# Patient Record
Sex: Female | Born: 1955 | Race: White | Hispanic: No | Marital: Married | Smoking: Never smoker
Health system: Southern US, Community
[De-identification: ages and names within clinical notes are randomized; demographics above are authoritative.]

## PROBLEM LIST (undated history)

## (undated) ENCOUNTER — Ambulatory Visit: Admission: EM | Payer: 59

## (undated) DIAGNOSIS — F411 Generalized anxiety disorder: Secondary | ICD-10-CM

## (undated) DIAGNOSIS — F329 Major depressive disorder, single episode, unspecified: Secondary | ICD-10-CM

## (undated) DIAGNOSIS — G4730 Sleep apnea, unspecified: Secondary | ICD-10-CM

## (undated) DIAGNOSIS — I10 Essential (primary) hypertension: Secondary | ICD-10-CM

## (undated) DIAGNOSIS — J189 Pneumonia, unspecified organism: Secondary | ICD-10-CM

## (undated) DIAGNOSIS — F32A Depression, unspecified: Secondary | ICD-10-CM

## (undated) DIAGNOSIS — G4700 Insomnia, unspecified: Secondary | ICD-10-CM

## (undated) DIAGNOSIS — K219 Gastro-esophageal reflux disease without esophagitis: Secondary | ICD-10-CM

## (undated) DIAGNOSIS — C44310 Basal cell carcinoma of skin of unspecified parts of face: Secondary | ICD-10-CM

## (undated) DIAGNOSIS — M1990 Unspecified osteoarthritis, unspecified site: Secondary | ICD-10-CM

## (undated) HISTORY — PX: DILATION AND CURETTAGE OF UTERUS: SHX78

## (undated) HISTORY — PX: WISDOM TOOTH EXTRACTION: SHX21

## (undated) HISTORY — DX: Basal cell carcinoma of skin of unspecified parts of face: C44.310

## (undated) HISTORY — DX: Generalized anxiety disorder: F41.1

## (undated) HISTORY — PX: COLONOSCOPY: SHX174

---

## 1998-10-15 ENCOUNTER — Other Ambulatory Visit: Admission: RE | Admit: 1998-10-15 | Discharge: 1998-10-15 | Payer: Self-pay | Admitting: Gynecology

## 1999-10-19 ENCOUNTER — Other Ambulatory Visit: Admission: RE | Admit: 1999-10-19 | Discharge: 1999-10-19 | Payer: Self-pay | Admitting: Gynecology

## 2000-10-25 ENCOUNTER — Other Ambulatory Visit: Admission: RE | Admit: 2000-10-25 | Discharge: 2000-10-25 | Payer: Self-pay | Admitting: Gynecology

## 2001-10-15 ENCOUNTER — Other Ambulatory Visit: Admission: RE | Admit: 2001-10-15 | Discharge: 2001-10-15 | Payer: Self-pay | Admitting: Gynecology

## 2002-10-28 ENCOUNTER — Other Ambulatory Visit: Admission: RE | Admit: 2002-10-28 | Discharge: 2002-10-28 | Payer: Self-pay | Admitting: Gynecology

## 2003-10-09 ENCOUNTER — Other Ambulatory Visit: Admission: RE | Admit: 2003-10-09 | Discharge: 2003-10-09 | Payer: Self-pay | Admitting: Gynecology

## 2004-10-19 ENCOUNTER — Other Ambulatory Visit: Admission: RE | Admit: 2004-10-19 | Discharge: 2004-10-19 | Payer: Self-pay | Admitting: Gynecology

## 2005-10-27 ENCOUNTER — Other Ambulatory Visit: Admission: RE | Admit: 2005-10-27 | Discharge: 2005-10-27 | Payer: Self-pay | Admitting: Gynecology

## 2006-11-02 ENCOUNTER — Other Ambulatory Visit: Admission: RE | Admit: 2006-11-02 | Discharge: 2006-11-02 | Payer: Self-pay | Admitting: Gynecology

## 2008-01-22 ENCOUNTER — Other Ambulatory Visit: Admission: RE | Admit: 2008-01-22 | Discharge: 2008-01-22 | Payer: Self-pay | Admitting: Gynecology

## 2008-02-13 ENCOUNTER — Ambulatory Visit (HOSPITAL_COMMUNITY): Admission: RE | Admit: 2008-02-13 | Discharge: 2008-02-13 | Payer: Self-pay | Admitting: Gynecology

## 2010-11-21 ENCOUNTER — Encounter: Payer: Self-pay | Admitting: Gynecology

## 2011-07-26 LAB — CREATININE, SERUM
Creatinine, Ser: 0.79
GFR calc Af Amer: >60
GFR calc non Af Amer: >60

## 2012-08-13 ENCOUNTER — Other Ambulatory Visit: Payer: Self-pay | Admitting: Orthopedic Surgery

## 2012-08-24 ENCOUNTER — Encounter (HOSPITAL_BASED_OUTPATIENT_CLINIC_OR_DEPARTMENT_OTHER): Payer: Self-pay | Admitting: *Deleted

## 2012-08-24 NOTE — Progress Notes (Signed)
[facility] [place]-to come in for [id number]

## 2012-08-28 ENCOUNTER — Encounter (HOSPITAL_BASED_OUTPATIENT_CLINIC_OR_DEPARTMENT_OTHER)
Admission: RE | Admit: 2012-08-28 | Discharge: 2012-08-28 | Disposition: A | Discharge status: Home / Self Care | Payer: 59 | Source: Ambulatory Visit | Attending: Orthopedic Surgery | Admitting: Orthopedic Surgery

## 2012-08-28 LAB — BASIC METABOLIC PANEL
BUN: 13 mg/dL (ref 6–23)
CO2: 26 mEq/L (ref 19–32)
Calcium: 9.1 mg/dL (ref 8.4–10.5)
Chloride: 100 mEq/L (ref 96–112)
Creatinine, Ser: 0.72 mg/dL (ref 0.50–1.10)
GFR calc Af Amer: >90 mL/min (ref >90)
GFR calc non Af Amer: >90 mL/min (ref >90)
Glucose, Bld: 82 mg/dL (ref 70–99)
Potassium: 3.9 mEq/L (ref 3.5–5.1)
Sodium: 136 mEq/L (ref 135–145)

## 2012-08-30 ENCOUNTER — Encounter (HOSPITAL_BASED_OUTPATIENT_CLINIC_OR_DEPARTMENT_OTHER): Payer: Self-pay | Admitting: Anesthesiology

## 2012-08-30 ENCOUNTER — Encounter (HOSPITAL_BASED_OUTPATIENT_CLINIC_OR_DEPARTMENT_OTHER): Payer: Self-pay

## 2012-08-30 ENCOUNTER — Encounter (HOSPITAL_BASED_OUTPATIENT_CLINIC_OR_DEPARTMENT_OTHER): Payer: Self-pay | Admitting: *Deleted

## 2012-08-30 ENCOUNTER — Encounter (HOSPITAL_BASED_OUTPATIENT_CLINIC_OR_DEPARTMENT_OTHER)
Admission: RE | Disposition: A | Discharge status: Home / Self Care | Payer: Self-pay | Source: Ambulatory Visit | Attending: Orthopedic Surgery

## 2012-08-30 ENCOUNTER — Ambulatory Visit (HOSPITAL_BASED_OUTPATIENT_CLINIC_OR_DEPARTMENT_OTHER)
Admission: RE | Admit: 2012-08-30 | Discharge: 2012-08-30 | Disposition: A | Discharge status: Home / Self Care | Payer: 59 | Source: Ambulatory Visit | Attending: Orthopedic Surgery | Admitting: Orthopedic Surgery

## 2012-08-30 ENCOUNTER — Ambulatory Visit (HOSPITAL_BASED_OUTPATIENT_CLINIC_OR_DEPARTMENT_OTHER): Payer: 59 | Admitting: *Deleted

## 2012-08-30 DIAGNOSIS — G4700 Insomnia, unspecified: Secondary | ICD-10-CM | POA: Insufficient documentation

## 2012-08-30 DIAGNOSIS — F329 Major depressive disorder, single episode, unspecified: Secondary | ICD-10-CM | POA: Insufficient documentation

## 2012-08-30 DIAGNOSIS — I10 Essential (primary) hypertension: Secondary | ICD-10-CM | POA: Insufficient documentation

## 2012-08-30 DIAGNOSIS — M129 Arthropathy, unspecified: Secondary | ICD-10-CM | POA: Insufficient documentation

## 2012-08-30 DIAGNOSIS — Z79899 Other long term (current) drug therapy: Secondary | ICD-10-CM | POA: Insufficient documentation

## 2012-08-30 DIAGNOSIS — M249 Joint derangement, unspecified: Secondary | ICD-10-CM | POA: Insufficient documentation

## 2012-08-30 DIAGNOSIS — M66239 Spontaneous rupture of extensor tendons, unspecified forearm: Secondary | ICD-10-CM

## 2012-08-30 DIAGNOSIS — Z01812 Encounter for preprocedural laboratory examination: Secondary | ICD-10-CM | POA: Insufficient documentation

## 2012-08-30 DIAGNOSIS — F3289 Other specified depressive episodes: Secondary | ICD-10-CM | POA: Insufficient documentation

## 2012-08-30 DIAGNOSIS — Z01810 Encounter for preprocedural cardiovascular examination: Secondary | ICD-10-CM | POA: Insufficient documentation

## 2012-08-30 HISTORY — PX: TENDON TRANSFER: SHX6109

## 2012-08-30 HISTORY — DX: Major depressive disorder, single episode, unspecified: F32.9

## 2012-08-30 HISTORY — DX: Depression, unspecified: F32.A

## 2012-08-30 HISTORY — DX: Essential (primary) hypertension: I10

## 2012-08-30 HISTORY — DX: Unspecified osteoarthritis, unspecified site: M19.90

## 2012-08-30 HISTORY — DX: Insomnia, unspecified: G47.00

## 2012-08-30 LAB — POCT HEMOGLOBIN-HEMACUE: Hemoglobin: 15.2 g/dL — ABNORMAL HIGH (ref 12.0–15.0)

## 2012-08-30 SURGERY — TRANSFER, TENDON
Anesthesia: General | Site: Thumb | Laterality: Left | Wound class: Clean

## 2012-08-30 MED ORDER — OXYCODONE HCL 5 MG PO TABS: 5.0000 mg | ORAL_TABLET | ORAL | Status: DC | PRN

## 2012-08-30 MED ORDER — LIDOCAINE HCL (CARDIAC) 20 MG/ML IV SOLN
INTRAVENOUS | Status: DC | PRN
  Administered 2012-08-30: 50 mg via INTRAVENOUS

## 2012-08-30 MED ORDER — HYDROMORPHONE HCL PF 1 MG/ML IJ SOLN: 0.2500 mg | INTRAMUSCULAR | Status: DC | PRN

## 2012-08-30 MED ORDER — MEPERIDINE HCL 25 MG/ML IJ SOLN: 6.2500 mg | INTRAMUSCULAR | Status: DC | PRN

## 2012-08-30 MED ORDER — OXYCODONE HCL 5 MG PO TABS: 5.0000 mg | ORAL_TABLET | Freq: Once | ORAL | Status: DC | PRN

## 2012-08-30 MED ORDER — FENTANYL CITRATE 0.05 MG/ML IJ SOLN
50.0000 ug | INTRAMUSCULAR | Status: DC | PRN
  Administered 2012-08-30: 100 ug via INTRAVENOUS

## 2012-08-30 MED ORDER — PROPOFOL 10 MG/ML IV BOLUS
INTRAVENOUS | Status: DC | PRN
  Administered 2012-08-30: 200 mg via INTRAVENOUS

## 2012-08-30 MED ORDER — EPHEDRINE SULFATE 50 MG/ML IJ SOLN
INTRAMUSCULAR | Status: DC | PRN
  Administered 2012-08-30 (×3): 5 mg via INTRAVENOUS

## 2012-08-30 MED ORDER — DEXAMETHASONE SODIUM PHOSPHATE 10 MG/ML IJ SOLN
INTRAMUSCULAR | Status: DC | PRN
  Administered 2012-08-30: 10 mg via INTRAVENOUS

## 2012-08-30 MED ORDER — LACTATED RINGERS IV SOLN
INTRAVENOUS | Status: DC
  Administered 2012-08-30 (×2): via INTRAVENOUS

## 2012-08-30 MED ORDER — SODIUM CHLORIDE 0.45 % IV SOLN: INTRAVENOUS | Status: DC

## 2012-08-30 MED ORDER — ROPIVACAINE HCL 5 MG/ML IJ SOLN
INTRAMUSCULAR | Status: DC | PRN
  Administered 2012-08-30: 30 mL via EPIDURAL

## 2012-08-30 MED ORDER — OXYCODONE HCL 5 MG/5ML PO SOLN: 5.0000 mg | Freq: Once | ORAL | Status: DC | PRN

## 2012-08-30 MED ORDER — CHLORHEXIDINE GLUCONATE 4 % EX LIQD: 60.0000 mL | Freq: Once | CUTANEOUS | Status: DC

## 2012-08-30 MED ORDER — MIDAZOLAM HCL 2 MG/2ML IJ SOLN
1.0000 mg | INTRAMUSCULAR | Status: DC | PRN
  Administered 2012-08-30: 2 mg via INTRAVENOUS

## 2012-08-30 MED ORDER — ONDANSETRON HCL 4 MG/2ML IJ SOLN: 4.0000 mg | Freq: Once | INTRAMUSCULAR | Status: DC | PRN

## 2012-08-30 MED ORDER — ONDANSETRON HCL 4 MG/2ML IJ SOLN
INTRAMUSCULAR | Status: DC | PRN
  Administered 2012-08-30: 4 mg via INTRAVENOUS

## 2012-08-30 MED ORDER — METHOCARBAMOL 500 MG PO TABS: 500.0000 mg | ORAL_TABLET | Freq: Four times a day (QID) | ORAL | Status: DC

## 2012-08-30 MED ORDER — CEFAZOLIN SODIUM-DEXTROSE 2-3 GM-% IV SOLR
2.0000 g | INTRAVENOUS | Status: AC
  Administered 2012-08-30: 2 g via INTRAVENOUS

## 2012-08-30 SURGICAL SUPPLY — 68 items
BANDAGE CONFORM 3  STR LF (GAUZE/BANDAGES/DRESSINGS) ×2 IMPLANT
BANDAGE ELASTIC 3 VELCRO ST LF (GAUZE/BANDAGES/DRESSINGS) ×2 IMPLANT
BANDAGE GAUZE ELAST BULKY 4 IN (GAUZE/BANDAGES/DRESSINGS) ×2 IMPLANT
BLADE OSC/SAG .038X5.5 CUT EDG (BLADE) IMPLANT
BLADE SURG 15 STRL LF DISP TIS (BLADE) ×3 IMPLANT
BLADE SURG 15 STRL SS (BLADE) ×6
BLADE SURG ROTATE 9660 (MISCELLANEOUS) IMPLANT
BRUSH SCRUB EZ PLAIN DRY (MISCELLANEOUS) ×2 IMPLANT
CANISTER SUCTION 1200CC (MISCELLANEOUS) IMPLANT
CLOTH BEACON ORANGE TIMEOUT ST (SAFETY) ×2 IMPLANT
CORDS BIPOLAR (ELECTRODE) ×2 IMPLANT
COVER MAYO STAND STRL (DRAPES) ×2 IMPLANT
COVER TABLE BACK 60X90 (DRAPES) ×2 IMPLANT
CUFF TOURNIQUET SINGLE 18IN (TOURNIQUET CUFF) ×2 IMPLANT
DECANTER SPIKE VIAL GLASS SM (MISCELLANEOUS) IMPLANT
DRAIN JACKSON RD 7FR 3/32 (WOUND CARE) IMPLANT
DRAPE EXTREMITY T 121X128X90 (DRAPE) ×2 IMPLANT
DRAPE SURG 17X23 STRL (DRAPES) ×2 IMPLANT
DRSG EMULSION OIL 3X3 NADH (GAUZE/BANDAGES/DRESSINGS) ×2 IMPLANT
GAUZE SPONGE 4X4 16PLY XRAY LF (GAUZE/BANDAGES/DRESSINGS) IMPLANT
GAUZE XEROFORM 1X8 LF (GAUZE/BANDAGES/DRESSINGS) ×2 IMPLANT
GLOVE BIO SURGEON STRL SZ8 (GLOVE) IMPLANT
GLOVE SKINSENSE NS SZ6.5 (GLOVE) ×2
GLOVE SKINSENSE NS SZ7.5 (GLOVE) ×1
GLOVE SKINSENSE NS SZ8.0 LF (GLOVE) ×1
GLOVE SKINSENSE STRL SZ6.5 (GLOVE) ×2 IMPLANT
GLOVE SKINSENSE STRL SZ7.5 (GLOVE) ×1 IMPLANT
GLOVE SKINSENSE STRL SZ8.0 LF (GLOVE) ×1 IMPLANT
GLOVE SS BIOGEL STRL SZ 8 (GLOVE) IMPLANT
GLOVE SUPERSENSE BIOGEL SZ 8 (GLOVE)
GOWN PREVENTION PLUS XLARGE (GOWN DISPOSABLE) ×4 IMPLANT
GOWN PREVENTION PLUS XXLARGE (GOWN DISPOSABLE) ×2 IMPLANT
NEEDLE HYPO 22GX1.5 SAFETY (NEEDLE) IMPLANT
NEEDLE HYPO 25X1 1.5 SAFETY (NEEDLE) IMPLANT
NS IRRIG 1000ML POUR BTL (IV SOLUTION) ×2 IMPLANT
PACK BASIN DAY SURGERY FS (CUSTOM PROCEDURE TRAY) ×2 IMPLANT
PAD CAST 3X4 CTTN HI CHSV (CAST SUPPLIES) ×1 IMPLANT
PADDING CAST ABS 3INX4YD NS (CAST SUPPLIES) ×1
PADDING CAST ABS 4INX4YD NS (CAST SUPPLIES) ×1
PADDING CAST ABS COTTON 3X4 (CAST SUPPLIES) ×1 IMPLANT
PADDING CAST ABS COTTON 4X4 ST (CAST SUPPLIES) ×1 IMPLANT
PADDING CAST COTTON 3X4 STRL (CAST SUPPLIES) ×1
PASSER SUT SWANSON 36MM LOOP (INSTRUMENTS) IMPLANT
SCOTCHCAST PLUS 3X4 WHITE (CAST SUPPLIES) ×4 IMPLANT
SHEET MEDIUM DRAPE 40X70 STRL (DRAPES) IMPLANT
SPLINT PLASTER CAST XFAST 3X15 (CAST SUPPLIES) IMPLANT
SPLINT PLASTER XTRA FASTSET 3X (CAST SUPPLIES)
SPONGE GAUZE 4X4 12PLY (GAUZE/BANDAGES/DRESSINGS) ×2 IMPLANT
STOCKINETTE 4X48 STRL (DRAPES) ×2 IMPLANT
STOCKINETTE SYNTHETIC 3 UNSTER (CAST SUPPLIES) ×2 IMPLANT
SUCTION FRAZIER TIP 10 FR DISP (SUCTIONS) ×2 IMPLANT
SUT BONE WAX W31G (SUTURE) IMPLANT
SUT FIBERWIRE 3-0 18 TAPR NDL (SUTURE) ×2
SUT FIBERWIRE 4-0 18 TAPR NDL (SUTURE) ×4
SUT PROLENE 4 0 PS 2 18 (SUTURE) ×4 IMPLANT
SUT VIC AB 4-0 P-3 18XBRD (SUTURE) IMPLANT
SUT VIC AB 4-0 P3 18 (SUTURE)
SUTURE FIBERWR 3-0 18 TAPR NDL (SUTURE) ×1 IMPLANT
SUTURE FIBERWR 4-0 18 TAPR NDL (SUTURE) ×2 IMPLANT
SYR BULB 3OZ (MISCELLANEOUS) ×2 IMPLANT
SYR CONTROL 10ML LL (SYRINGE) IMPLANT
TAPE SURG TRANSPORE 1 IN (GAUZE/BANDAGES/DRESSINGS) ×1 IMPLANT
TAPE SURGICAL TRANSPORE 1 IN (GAUZE/BANDAGES/DRESSINGS) ×1
TOWEL OR 17X24 6PK STRL BLUE (TOWEL DISPOSABLE) ×2 IMPLANT
TOWEL OR NON WOVEN STRL DISP B (DISPOSABLE) ×2 IMPLANT
TUBE CONNECTING 20X1/4 (TUBING) ×2 IMPLANT
UNDERPAD 30X30 INCONTINENT (UNDERPADS AND DIAPERS) ×2 IMPLANT
WATER STERILE IRR 1000ML POUR (IV SOLUTION) IMPLANT

## 2012-08-30 NOTE — Anesthesia Procedure Notes (Addendum)
Anesthesia Regional Block:  Supraclavicular block  Pre-Anesthetic Checklist: ,, timeout performed, Correct Patient, Correct Site, Correct Laterality, Correct Procedure, Correct Position, site marked, Risks and benefits discussed,  Surgical consent,  Pre-op evaluation,  At surgeon's request and post-op pain management  Laterality: Left  Prep: chloraprep       Needles:  Injection technique: Single-shot  Needle Type: Echogenic Stimulator Needle     Needle Length: 5cm 5 cm     Additional Needles:  Procedures: ultrasound guided (picture in chart) and nerve stimulator Supraclavicular block  Nerve Stimulator or Paresthesia:  Response: 0.4 mA,   Additional Responses:   Narrative:  Start time: 08/30/2012 12:40 PM End time: 08/30/2012 12:50 PM Injection made incrementally with aspirations every 5 mL.  Performed by: Personally  Anesthesiologist: Arta Bruce MD  Additional Notes: Monitors applied. Patient sedated. Sterile prep and drape,hand hygiene and sterile gloves were used. Relevant anatomy identified.Needle position confirmed.Local anesthetic injected incrementally after negative aspiration. Local anesthetic spread visualized around nerve(s). Vascular puncture avoided. No complications. Image printed for medical record.The patient tolerated the procedure well.       Supraclavicular block Procedure Name: LMA Insertion Date/Time: 08/30/2012 1:51 PM Performed by: Suann Larry WOLFE Pre-anesthesia Checklist: Patient identified, Emergency Drugs available, Suction available and Patient being monitored Patient Re-evaluated:Patient Re-evaluated prior to inductionOxygen Delivery Method: Circle System Utilized Preoxygenation: Pre-oxygenation with 100% oxygen Intubation Type: IV induction Ventilation: Mask ventilation without difficulty LMA: LMA inserted LMA Size: 4.0 Number of attempts: 1 Airway Equipment and Method: bite block Placement Confirmation: positive ETCO2 and breath  sounds checked- equal and bilateral Tube secured with: Tape Dental Injury: Teeth and Oropharynx as per pre-operative assessment

## 2012-08-30 NOTE — H&P (Signed)
Whitney [name] is an 56 y.o. female.   Chief Complaint: Left EPL Rupture HPI: Whitney KitchenMarland KitchenPatient presents for evaluation and treatment of the of their upper extremity predicament. The patient denies neck back chest or of abdominal pain. The patient notes that they have no lower extremity problems. The patient from primarily complains of the upper extremity pain noted.  Past Medical History  Diagnosis Date  . Hypertension   . Depression   . Insomnia   . Arthritis     Past Surgical History  Procedure Date  . Cesarean section     x2  . Dilation and curettage of uterus   . Wisdom tooth extraction     History reviewed. No pertinent family history. Social History:  reports that she quit smoking about 10 years ago. She does not have any smokeless tobacco history on file. She reports that she drinks alcohol. She reports that she does not use illicit drugs.  Allergies:  Allergies  Allergen Reactions  . Latex Rash    Medications Prior to Admission  Medication Sig Dispense Refill  . citalopram (CELEXA) 20 MG tablet Take 20 mg by mouth at bedtime. Takes 1 1/2      . losartan (COZAAR) 50 MG tablet Take 50 mg by mouth at bedtime.      . metoprolol succinate (TOPROL-XL) 100 MG 24 hr tablet Take 100 mg by mouth at bedtime. Take with or immediately following a meal.      . traMADol (ULTRAM) 50 MG tablet Take 50 mg by mouth every 6 (six) hours as needed.      . traZODone (DESYREL) 50 MG tablet Take 50 mg by mouth at bedtime.        Results for orders placed during the hospital encounter of 08/30/12 (from the past 48 hour(s))  POCT HEMOGLOBIN-HEMACUE     Status: Abnormal   Collection Time   08/30/12 12:16 PM      Component Value Range Comment   Hemoglobin 15.2 (*) 12.0 - 15.0 g/dL    No results found.  Review of Systems  Constitutional: Negative.   HENT: Negative.   Eyes: Negative.   Respiratory: Negative.   Cardiovascular: Negative.   Gastrointestinal: Negative.   Genitourinary:  Negative.   Skin: Negative.   Endo/Heme/Allergies: Negative.     Blood pressure 96/67, pulse 75, temperature 98.3 F (36.8 C), temperature source Oral, resp. rate 14, height 5\' 7"  (1.702 m), weight 73.936 kg (163 lb), SpO2 93.00%. Physical Exam ..The patient is alert and oriented in no acute distress the patient complains of pain in the affected upper extremity. The patient is noted to have a normal HEENT exam. Lung fields show equal chest expansion and no shortness of breath abdomen exam is nontender without distention. Lower extremity examination does not show any fracture dislocation or blood clot symptoms. Pelvis is stable neck and back are stable and nontender  Assessment/Plan .Whitney KitchenWe are planning surgery for your upper extremity. The risk and benefits of surgery include risk of bleeding infection anesthesia damage to normal structures and failure of the surgery to accomplish its intended goals of relieving symptoms and restoring function with this in mind we'll going to proceed. I have specifically discussed with the patient the pre-and postoperative regime and the does and don'ts and risk and benefits in great detail. Risk and benefits of surgery also include risk of dystrophy chronic nerve pain failure of the healing process to go onto completion and other inherent risks of surgery The relavent the pathophysiology  of the disease/injury process, as well as the alternatives for treatment and postoperative course of action has been discussed in great detail with the patient who desires to proceed.  We will do everything in our power to help you (the patient) restore function to the upper extremity. Is a pleasure to see this patient today.   Karen Chafe 08/30/2012, 1:37 PM

## 2012-08-30 NOTE — Anesthesia Postprocedure Evaluation (Signed)
  Anesthesia Post-op Note  Patient: Whitney [name]  Procedure(s) Performed: Procedure(s) (LRB) with comments: TENDON TRANSFER (Left) - LEFT THUMB EXTENSOR INDICUS POLLICUS TO EXTENSOR POLLICUS LONGUS TENDON TRANSFER GENERAL WITH BLOCK  Patient Location: PACU  Anesthesia Type:GA combined with regional for post-op pain  Level of Consciousness: awake and alert   Airway and Oxygen Therapy: Patient Spontanous Breathing  Post-op Pain: none  Post-op Assessment: Post-op Vital signs reviewed, Patient's Cardiovascular Status Stable, Respiratory Function Stable, Patent Airway and No signs of Nausea or vomiting  Post-op Vital Signs: Reviewed and stable  Complications: No apparent anesthesia complications

## 2012-08-30 NOTE — Transfer of Care (Signed)
Immediate Anesthesia Transfer of Care Note  Patient: Whitney [name]  Procedure(s) Performed: Procedure(s) (LRB) with comments: TENDON TRANSFER (Left) - LEFT THUMB EXTENSOR INDICUS POLLICUS TO EXTENSOR POLLICUS LONGUS TENDON TRANSFER GENERAL WITH BLOCK  Patient Location: PACU  Anesthesia Type:General and Regional  Level of Consciousness: awake, alert  and oriented  Airway & Oxygen Therapy: Patient Spontanous Breathing and Patient connected to face mask oxygen  Post-op Assessment: Report given to PACU RN and Post -op Vital signs reviewed and stable  Post vital signs: Reviewed and stable  Complications: No apparent anesthesia complications

## 2012-08-30 NOTE — Progress Notes (Signed)
Assisted Dr. Ossey with left, ultrasound guided, supraclavicular block. Side rails up, monitors on throughout procedure. See vital signs in flow sheet. Tolerated Procedure well. 

## 2012-08-30 NOTE — Anesthesia Preprocedure Evaluation (Addendum)
Anesthesia Evaluation  Patient identified by MRN, date of birth, ID band Patient awake    Reviewed: Allergy & Precautions, H&P , NPO status , Patient's Chart, lab work & pertinent test results  Airway Mallampati: I TM Distance: >3 FB Neck ROM: Full    Dental   Pulmonary          Cardiovascular hypertension, Pt. on medications     Neuro/Psych    GI/Hepatic   Endo/Other    Renal/GU      Musculoskeletal   Abdominal   Peds  Hematology   Anesthesia Other Findings   Reproductive/Obstetrics                           Anesthesia Physical Anesthesia Plan  ASA: II  Anesthesia Plan: General   Post-op Pain Management:    Induction: Intravenous  Airway Management Planned: LMA  Additional Equipment:   Intra-op Plan:   Post-operative Plan: Extubation in OR  Informed Consent: I have reviewed the patients History and Physical, chart, labs and discussed the procedure including the risks, benefits and alternatives for the proposed anesthesia with the patient or authorized representative who has indicated his/her understanding and acceptance.     Plan Discussed with: CRNA and Surgeon  Anesthesia Plan Comments:         Anesthesia Quick Evaluation  

## 2012-08-30 NOTE — Op Note (Signed)
See Dictation # [id number] Dominica Severin MD

## 2012-08-31 ENCOUNTER — Encounter (HOSPITAL_BASED_OUTPATIENT_CLINIC_OR_DEPARTMENT_OTHER): Payer: Self-pay | Admitting: Orthopedic Surgery

## 2012-09-03 NOTE — Op Note (Signed)
[name], [name]             ACCOUNT NO.:  [id number]  MEDICAL RECORD NO.:  [id number]  LOCATION:                                 FACILITY:  PHYSICIAN:  Dionne Ano. Adreena Willits, M.D.DATE OF BIRTH:  1955/11/23  DATE OF PROCEDURE:  08/30/2012 DATE OF DISCHARGE:                              OPERATIVE REPORT   PREOPERATIVE DIAGNOSIS:  Extensor pollicis longus tendon rupture, left wrist.  POSTOPERATIVE DIAGNOSIS:  Extensor pollicis longus tendon rupture, left wrist.  PROCEDURE: 1. Extensor indicis proprius tendon transfer to the extensor pollicis     longus secondary to chronic EPL tendon rupture. 2. EPL tenolysis, tenosynovectomy, extensive in nature.  SURGEON:  Dionne Ano. Amanda Pea, M.D.  ASSISTANT:  Karie Chimera, PA-C.  COMPLICATIONS:  None.  ANESTHESIA:  General.  INDICATIONS FOR THE PROCEDURE:  This patient is a 56 year old female who presents with the above-mentioned diagnosis.  I have counseled her in regards to risks and benefits of surgery including risk of infection, bleeding, anesthesia, damage to normal structures, and failure of surgery to accomplish its intended goals of relieving symptoms and restoring function.  With this in mind, she desires to proceed.  All questions have been encouraged and answered preoperatively.  OPERATIVE PROCEDURE:  The patient was seen by myself, anesthesia, taken to operative suite, time-out called.  Pre and postop check list was complete.  General anesthesia induced.  Prepped and draped in usual sterile fashion about the left upper extremity.  Once this done, time- out was called once again.  Tourniquet insufflated and an incision was made.  The patient had an incision made about the midportion of her 1st metacarpal dorsal sensory neurolysis of the radial branch to the superficial radial nerve and ulnar branch of the superficial radial nerve was accomplished, identified the ruptured stump in the EPL, performed tenolysis,  tenosynovectomy.  Following this, I then performed an incision about the area proximal to the second MCP joint.  Dissection was carried down.  The EIP and the [facility] tendons were evaluated and looked at.  The EIP tendon was severed, stumped distally, was sutured to the [facility].  Following this, I then made a counter incision at Lister's tubercle, dissected slightly in an ulnar direction, identified the EIP and retracted it.  Once this was done, the EIP was then subcutaneously tunneled to the EPL tendon in the main incision (the 1st incision created).  Superficial radial nerve was kept dorsal and out of harm's way.  I then performed a Pulvertaft weave technique and the tendon was repaired.  The patient tolerated this well.  There were no complicating features.  Following tendon repair with a Pulvertaft weave, I checked the tension multiple times.  I was able to make 4-5 passes with Pulvertaft weave and secured excellent fixation.  I was pleased with the tenodesis effect.  The patient had full extension with wrist flexion and a good gentle passive flexion with wrist extension.  I was pleased with the findings.  Following this, I irrigated with the tourniquet deflated, closed the wound with interrupted Prolene.  Tenolysis and tenosynovectomy was accomplished without difficulty.  There were no complicating features.  All sponge, needle, and instrument counts were reported as correct.  The patient will be monitored in the recovery room.  Discharged to home.  Elevation, range of motion, other measures will be adhered to.  Should any problems arise, the patient will notify me.  I would like to look forward to seeing the patient back in 12-14 days.  Sutures to be removed and a new cast applied at 4 weeks postop, will begin therapy and very gentle motion at 6 weeks postop, aggressive motion at 8 weeks postop.  If that all looks well, some mild gentle interval strengthening.     Dionne Ano. Amanda Pea,  M.D.     [facility]  D:  08/30/2012  T:  08/31/2012  Job:  [id number]

## 2013-04-11 ENCOUNTER — Other Ambulatory Visit (HOSPITAL_COMMUNITY): Payer: Self-pay | Admitting: Family Medicine

## 2013-04-11 DIAGNOSIS — R0602 Shortness of breath: Secondary | ICD-10-CM

## 2013-04-12 ENCOUNTER — Ambulatory Visit (HOSPITAL_COMMUNITY)
Admission: RE | Admit: 2013-04-12 | Discharge: 2013-04-12 | Disposition: A | Discharge status: Home / Self Care | Payer: 59 | Source: Ambulatory Visit | Attending: Family Medicine | Admitting: Family Medicine

## 2013-04-12 DIAGNOSIS — IMO0002 Reserved for concepts with insufficient information to code with codable children: Secondary | ICD-10-CM | POA: Insufficient documentation

## 2013-04-12 DIAGNOSIS — R918 Other nonspecific abnormal finding of lung field: Secondary | ICD-10-CM | POA: Insufficient documentation

## 2013-04-12 DIAGNOSIS — R0602 Shortness of breath: Secondary | ICD-10-CM | POA: Insufficient documentation

## 2013-04-12 DIAGNOSIS — K7689 Other specified diseases of liver: Secondary | ICD-10-CM | POA: Insufficient documentation

## 2013-04-12 MED ORDER — IOHEXOL 350 MG/ML SOLN
100.0000 mL | Freq: Once | INTRAVENOUS | Status: AC | PRN
  Administered 2013-04-12: 70 mL via INTRAVENOUS

## 2013-05-21 ENCOUNTER — Ambulatory Visit (INDEPENDENT_AMBULATORY_CARE_PROVIDER_SITE_OTHER): Payer: 59 | Admitting: Internal Medicine

## 2013-05-21 ENCOUNTER — Encounter: Payer: Self-pay | Admitting: Internal Medicine

## 2013-05-21 VITALS — BP 130/90 | HR 68 | Temp 98.2°F | Ht 65.0 in | Wt 176.0 lb

## 2013-05-21 DIAGNOSIS — R0989 Other specified symptoms and signs involving the circulatory and respiratory systems: Secondary | ICD-10-CM

## 2013-05-21 DIAGNOSIS — R0609 Other forms of dyspnea: Secondary | ICD-10-CM

## 2013-05-21 DIAGNOSIS — R918 Other nonspecific abnormal finding of lung field: Secondary | ICD-10-CM

## 2013-05-21 DIAGNOSIS — R0600 Dyspnea, unspecified: Secondary | ICD-10-CM

## 2013-05-21 MED ORDER — FAMOTIDINE 20 MG PO TABS: ORAL_TABLET | ORAL | Status: DC

## 2013-05-21 MED ORDER — METOPROLOL SUCCINATE ER 100 MG PO TB24: ORAL_TABLET | ORAL | Status: AC

## 2013-05-21 MED ORDER — PANTOPRAZOLE SODIUM 40 MG PO TBEC: 40.0000 mg | DELAYED_RELEASE_TABLET | Freq: Every day | ORAL | Status: DC

## 2013-05-21 NOTE — Progress Notes (Signed)
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    05/21/2013 1st pulmonary eval  Cc progressively worse doe x across the parking lot indolent onset starting about  6 months prior to OV  , and 3 months not as easy on steps, assoc with irritating dry cough, assoc with globus and sense of too much throat mucus. Was on acei d/c ? When,  Assoc nasal congestion, clariton didn't help.  Assoc also with intermittent hb but not consistently on rx.  No obvious daytime variabilty or assoc chronic cough or cp or chest tightness, subjective wheeze overt sinus or hb symptoms. No unusual exp hx or h/o childhood pna/ asthma or knowledge of premature birth.   Sleeping ok without nocturnal  or early am exacerbation  of respiratory  c/o's or need for noct saba. Also denies any obvious fluctuation of symptoms with weather or environmental changes or other aggravating or alleviating factors except as outlined above    Review of Systems  Constitutional: Negative for fever, chills and unexpected weight change.  HENT: Positive for congestion and trouble swallowing. Negative for ear pain, nosebleeds, sore throat, rhinorrhea, sneezing, dental problem, voice change, postnasal drip and sinus pressure.   Eyes: Negative for visual disturbance.  Respiratory: Positive for cough and shortness of breath. Negative for choking.   Cardiovascular: Negative for chest pain and leg swelling.  Gastrointestinal: Negative for vomiting, abdominal pain and diarrhea.  Genitourinary: Negative for difficulty urinating.       Acid Heartburn  Musculoskeletal: Negative for arthralgias.  Skin: Negative for rash.  Neurological: Negative for tremors,  syncope and headaches.  Hematological: Does not bruise/bleed easily.       Objective:   Physical Exam   amb wf with voice fatigue and classic pseudowheeze  Wt Readings from Last 3 Encounters:  05/21/13 176 lb (79.833 kg)  08/30/12 163 lb (73.936 kg)  08/30/12 163 lb (73.936 kg)    HEENT: nl dentition, turbinates, and orophanx. Nl external ear canals without cough reflex   NECK :  without JVD/Nodes/TM/ nl carotid upstrokes bilaterally   LUNGS: no acc muscle use, clear to A and P bilaterally without cough on insp or exp maneuvers   CV:  RRR  no s3 or murmur or increase in P2, no edema   ABD:  soft and nontender with nl excursion in the supine position. No bruits or organomegaly, bowel sounds nl  MS:  warm without deformities, calf tenderness, cyanosis or clubbing  SKIN: warm and dry without lesions    NEURO:  alert, approp, no deficits      04/11/13 CTa chest 1. No acute cardiopulmonary disease. Specifically, no evidence of  pulmonary embolism.  2. Indeterminate bilateral pulmonary nodules, the largest of which  measure 4 mm in diameter. If the patient is at high risk for  bronchogenic carcinoma, follow-up chest CT at 1 year is  recommended. If the patient is at low risk, no follow-up is  needed. This recommendation follows the consensus statement:  Guidelines for Management of Small Pulmonary Nodules Detected on CT  Scans: A Statement from the Fleischner Society as published in  Radiology  2005; 161:096-045.  3. Moderate multilevel thoracic spine DDD.  4. Hepatic steatosis.          Assessment & Plan:

## 2013-05-21 NOTE — Patient Instructions (Addendum)
Pantoprazole (protonix) 40 mg   Take 30-60 min before first meal of the day and Pepcid(famotidine) 20 mg one bedtime until return to office - this is the best way to tell whether stomach acid is contributing to your problem.    Stop prilosec   GERD (REFLUX)  is an extremely common cause of respiratory symptoms, many times with no significant heartburn at all.    It can be treated with medication, but also with lifestyle changes including avoidance of late meals, excessive alcohol, smoking cessation, and avoid fatty foods, chocolate, peppermint, colas, red wine, and acidic juices such as orange juice.  NO MINT OR MENTHOL PRODUCTS SO NO COUGH DROPS  USE SUGARLESS CANDY INSTEAD (jolley ranchers or Stover's)  NO OIL BASED VITAMINS - use powdered substitutes.    Please schedule a follow up office visit in 4 weeks, sooner if needed with pft's on return.

## 2013-05-22 DIAGNOSIS — R918 Other nonspecific abnormal finding of lung field: Secondary | ICD-10-CM | POA: Insufficient documentation

## 2013-05-22 NOTE — Assessment & Plan Note (Signed)
-   05/21/2013  Walked RA x 3 laps @ 185 ft each stopped due to  End of study, no desat  Not able to reproduce sob across the parking lot so Symptoms are   disproportionate to objective findings and not clear this is a lung problem but pt does appear to have difficult airway management issues.  DDX of  difficult airways managment all start with A and  include Adherence, Ace Inhibitors, Acid Reflux, Active Sinus Disease, Alpha 1 Antitripsin deficiency, Anxiety masquerading as Airways dz,  ABPA,  allergy(esp in young), Aspiration (esp in elderly), Adverse effects of DPI,  Active smokers, plus two Bs  = Bronchiectasis and Beta blocker use..and one C= CHF  ACEi already excluded but may have contributed to the problem and should be avoided in this setting   ? Acid reflux > freq the "cause" of acei intolerance and both serve to destabilize the upper airway leading to  Classic Upper airway cough syndrome, so named because it's frequently impossible to sort out how much is  CR/sinusitis with freq throat clearing (which can be related to primary GERD)   vs  causing  secondary (" extra esophageal")  GERD from wide swings in gastric pressure that occur with throat clearing, often  promoting self use of mint and menthol lozenges that reduce the lower esophageal sphincter tone and exacerbate the problem further in a cyclical fashion.   These are the same pts (now being labeled as having "irritable larynx syndrome" by some cough centers) who not infrequently have a history of having failed to tolerate ace inhibitors,  dry powder inhalers or biphosphonates or report having atypical reflux symptoms that don't respond to standard doses of PPI , and are easily confused as having aecopd or asthma flares by even experienced allergists/ pulmonologists.   rec max gerd rx then regroup with pft's in 4 weeks

## 2013-05-22 NOTE — Assessment & Plan Note (Signed)
She is low but not zero risk because of fm hx and passive smoke exp so Discussed in detail all the  indications, usual  risks and alternatives  relative to the benefits with patient who agrees to proceed with bronchoscopy with repeat study in 04/11/14

## 2013-06-27 ENCOUNTER — Ambulatory Visit: Payer: 59 | Admitting: Internal Medicine

## 2013-07-25 ENCOUNTER — Encounter: Payer: Self-pay | Admitting: Internal Medicine

## 2013-07-25 ENCOUNTER — Ambulatory Visit (INDEPENDENT_AMBULATORY_CARE_PROVIDER_SITE_OTHER): Payer: 59 | Admitting: Internal Medicine

## 2013-07-25 VITALS — BP 140/80 | HR 73 | Temp 98.7°F | Ht 66.0 in | Wt 176.0 lb

## 2013-07-25 DIAGNOSIS — R918 Other nonspecific abnormal finding of lung field: Secondary | ICD-10-CM

## 2013-07-25 DIAGNOSIS — R0609 Other forms of dyspnea: Secondary | ICD-10-CM

## 2013-07-25 DIAGNOSIS — R0600 Dyspnea, unspecified: Secondary | ICD-10-CM

## 2013-07-25 DIAGNOSIS — R0989 Other specified symptoms and signs involving the circulatory and respiratory systems: Secondary | ICD-10-CM

## 2013-07-25 LAB — PULMONARY FUNCTION TEST

## 2013-07-25 NOTE — Progress Notes (Signed)
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    05/21/2013 1st pulmonary eval  Cc progressively worse doe x across the parking lot indolent onset starting about  6 months prior to OV  , and 3 months not as easy on steps, assoc with irritating dry cough, assoc with globus and sense of too much throat mucus. Was on acei d/c ? When,  Assoc nasal congestion, clariton didn't help.  Assoc also with intermittent hb but not consistently on rx. rec Pantoprazole (protonix) 40 mg   Take 30-60 min before first meal of the day and Pepcid(famotidine) 20 mg one bedtime until return to office - this is the best way to tell whether stomach acid is contributing to your problem.   Stop prilosec  GERD diet   07/25/2013 f/u ov/Wert re: sob/ cough better p rx as above Chief Complaint  Patient presents with  . Followup with PFT    Cough has resolved and her breathing is much improved. No new co's today.  not limited from desired activities as long as pases herself, no need for saba or cough meds  No obvious daytime variabilty or assoc chronic cough or cp or chest tightness, subjective wheeze overt sinus or hb symptoms. No unusual exp hx or h/o childhood pna/ asthma or knowledge of premature birth.   Sleeping ok without nocturnal  or early am exacerbation  of respiratory  c/o's or need for noct saba. Also denies any obvious fluctuation of symptoms with weather or environmental changes or other aggravating or alleviating factors except as outlined above         Objective:   Physical Exam   amb wf with slt voice fatigue - much better   Wt Readings from Last 3 Encounters:  07/25/13 176 lb (79.833  kg)  05/21/13 176 lb (79.833 kg)  08/30/12 163 lb (73.936 kg)       HEENT: nl dentition, turbinates, and orophanx. Nl external ear canals without cough reflex   NECK :  without JVD/Nodes/TM/ nl carotid upstrokes bilaterally   LUNGS: no acc muscle use, clear to A and P bilaterally without cough on insp or exp maneuvers   CV:  RRR  no s3 or murmur or increase in P2, no edema   ABD:  soft and nontender with nl excursion in the supine position. No bruits or organomegaly, bowel sounds nl  MS:  warm without deformities, calf tenderness, cyanosis or clubbing  SKIN: warm and dry without lesions           04/11/13 CTa chest 1. No acute cardiopulmonary disease. Specifically, no evidence of  pulmonary embolism.  2. Indeterminate bilateral pulmonary nodules, the largest of which  measure 4 mm in diameter. If the patient is at high risk for  bronchogenic carcinoma, follow-up chest CT at 1 year is  recommended. If the patient is at low risk, no follow-up is  needed. This recommendation follows the consensus statement:  Guidelines for Management of Small Pulmonary Nodules Detected on CT  Scans: A Statement from the Fleischner Society as published in  Radiology 2005; 237:395-400.  3.  Moderate multilevel thoracic spine DDD.  4. Hepatic steatosis.          Assessment & Plan:

## 2013-07-25 NOTE — Assessment & Plan Note (Signed)
-   05/21/2013  Walked RA x 3 laps @ 185 ft each stopped due to  End of study, no desat - 07/25/2013 PFTs VC 68% s obst  DLCO 69 but corrects to 101%  Most likely this is obesity with element of deconditioning > reviewed pfts and reconditioning/ wt loss > f/u Dr Uvaldo Rising

## 2013-07-25 NOTE — Progress Notes (Signed)
PFT done today. 

## 2013-07-25 NOTE — Assessment & Plan Note (Signed)
-   CT 04/11/13 bilateral 4mm > in tickle file for recall 04/11/14   Not previous entry was erroneous, f/u is for one year based on Fleischner soc guidelines, reviewed with pt

## 2013-07-25 NOTE — Patient Instructions (Addendum)
Weight control is simply a matter of calorie balance which needs to be tilted in your favor by eating less and exercising more.  To get the most out of exercise, you need to be continuously aware that you are short of breath, but never out of breath, for 30 minutes daily. As you improve, it will actually be easier for you to do the same amount of exercise  in  30 minutes so always push to the level where you are short of breath.  If this does not result in gradual weight reduction then I strongly recommend you see a nutritionist with a food diary x 2 weeks so that we can work out a negative calorie balance which is universally effective in steady weight loss programs.  Think of your calorie balance like you do your bank account where in this case you want the balance to go down so you must take in less calories than you burn up.  It's just that simple:  Hard to do, but easy to understand.  Good luck!    Pulmonary follow up is as needed  

## 2013-08-20 ENCOUNTER — Other Ambulatory Visit: Payer: Self-pay | Admitting: Internal Medicine

## 2013-08-20 DIAGNOSIS — R0600 Dyspnea, unspecified: Secondary | ICD-10-CM

## 2013-08-21 ENCOUNTER — Other Ambulatory Visit: Payer: Self-pay | Admitting: Internal Medicine

## 2013-08-21 MED ORDER — PANTOPRAZOLE SODIUM 40 MG PO TBEC: 40.0000 mg | DELAYED_RELEASE_TABLET | Freq: Every day | ORAL | Status: DC

## 2013-08-21 MED ORDER — FAMOTIDINE 20 MG PO TABS: ORAL_TABLET | ORAL | Status: DC

## 2013-08-30 ENCOUNTER — Encounter: Payer: Self-pay | Admitting: Internal Medicine

## 2014-04-04 ENCOUNTER — Telehealth: Payer: Self-pay | Admitting: *Deleted

## 2014-04-04 DIAGNOSIS — R918 Other nonspecific abnormal finding of lung field: Secondary | ICD-10-CM

## 2014-04-04 NOTE — Telephone Encounter (Signed)
LMTCB for the pt 

## 2014-04-04 NOTE — Telephone Encounter (Signed)
Message copied by Rosana Berger on Fri Apr 04, 2014 10:14 AM ------      Message from: Christinia Gully B      Created: Thu Jul 25, 2013  1:19 PM       Ct chest no contrast  ------

## 2014-04-09 NOTE — Telephone Encounter (Signed)
Spoke with the pt and notified that she is due for ct chest  She verbalized understanding Order was sent to [facility]

## 2014-04-15 ENCOUNTER — Encounter: Payer: Self-pay | Admitting: Internal Medicine

## 2014-04-15 ENCOUNTER — Ambulatory Visit (INDEPENDENT_AMBULATORY_CARE_PROVIDER_SITE_OTHER)
Admission: RE | Admit: 2014-04-15 | Discharge: 2014-04-15 | Disposition: A | Discharge status: Home / Self Care | Payer: 59 | Source: Ambulatory Visit | Attending: Internal Medicine | Admitting: Internal Medicine

## 2014-04-15 DIAGNOSIS — R918 Other nonspecific abnormal finding of lung field: Secondary | ICD-10-CM

## 2014-04-16 ENCOUNTER — Inpatient Hospital Stay: Admission: RE | Admit: 2014-04-16 | Payer: 59 | Source: Ambulatory Visit

## 2014-08-14 ENCOUNTER — Ambulatory Visit (HOSPITAL_BASED_OUTPATIENT_CLINIC_OR_DEPARTMENT_OTHER): Payer: 59 | Attending: Internal Medicine | Admitting: Radiology

## 2014-08-14 VITALS — Ht 66.0 in | Wt 175.0 lb

## 2014-08-14 DIAGNOSIS — G4710 Hypersomnia, unspecified: Secondary | ICD-10-CM | POA: Diagnosis present

## 2014-08-14 DIAGNOSIS — G4730 Sleep apnea, unspecified: Secondary | ICD-10-CM | POA: Diagnosis present

## 2014-08-14 DIAGNOSIS — R0683 Snoring: Secondary | ICD-10-CM | POA: Diagnosis not present

## 2014-08-14 DIAGNOSIS — R5383 Other fatigue: Secondary | ICD-10-CM

## 2014-08-16 DIAGNOSIS — R5383 Other fatigue: Secondary | ICD-10-CM

## 2014-08-16 DIAGNOSIS — R0683 Snoring: Secondary | ICD-10-CM

## 2014-08-16 NOTE — Sleep Study (Signed)
   NAME: ALIVIAH SPAIN DATE OF BIRTH:  September 05, 1956 MEDICAL RECORD NUMBER [id number]  LOCATION: [facility]  PHYSICIAN: Rhianna Raulerson D  DATE OF STUDY: 08/14/2014  SLEEP STUDY TYPE: Nocturnal Polysomnogram               REFERRING PHYSICIAN: Horton Finer,*  INDICATION FOR STUDY: Hypersomnia with sleep apnea  EPWORTH SLEEPINESS SCORE:   6/24 HEIGHT: 5\' 6"  (167.6 cm)  WEIGHT: 175 lb (79.379 kg)    Body mass index is 28.26 kg/(m^2).  NECK SIZE: 14 in.  MEDICATIONS: Charted for review  SLEEP ARCHITECTURE: Total sleep time 363 minutes with sleep efficiency 84.4%. Stage I was 9.5%, stage II 76%, stage III absent, REM 14.5% of total sleep time. Sleep latency 42.5 minutes, REM latency 172 minutes, awake after sleep onset 24.5 minutes, arousal index 12.2, bedtime medication: Famotidine  RESPIRATORY DATA: Apnea hypopneas index (AHI) 4.8 per hour. 29 total events scored including 9 obstructive apneas and 20 hypopneas. Events were seen all positions. REM AHI 10.3 per hour. The study was ordered as a diagnostic polysomnogram without CPAP.  OXYGEN DATA: Moderate to occasionally loud snoring with oxygen desaturation to a nadir of 85% and mean saturation 93.8% on room air.  CARDIAC DATA: Sinus rhythm with PACs and PVCs  MOVEMENT/PARASOMNIA: 150 total limb jerks were counted of which 7 were associated with arousals or awakenings for a periodic limb movement with arousal index of 1.2 per hour. Bathroom x1  IMPRESSION/ RECOMMENDATION:   1) Unremarkable sleep architecture for sleep center environment. Famotidine taken at bedtime. 2) Occasional respiratory events for sleep disturbance, within normal limits. AHI 4.8 per hour. The normal range for adults is an AHI between 0 and 5 events per hour. Moderate to occasionally loud snoring with oxygen desaturation to a nadir of 85% and mean saturation 93.8% on room air. 3) Periodic limb movement was noted during the first 2 hours  of sleep. 150 limb jerks were counted of which 7 were associated with arousal or awakenings for a periodic limb movement with arousal index of 1.2 per hour.  Deneise Lever Diplomate, American Board of Sleep Medicine  ELECTRONICALLY SIGNED ON:  08/16/2014, 1:57 PM [place] PH: [phone]   FX: [phone] [place]

## 2014-09-02 ENCOUNTER — Other Ambulatory Visit: Payer: Self-pay | Admitting: Internal Medicine

## 2014-09-11 ENCOUNTER — Other Ambulatory Visit: Payer: Self-pay | Admitting: Internal Medicine

## 2014-09-17 ENCOUNTER — Other Ambulatory Visit: Payer: Self-pay | Admitting: Internal Medicine

## 2014-09-17 MED ORDER — PANTOPRAZOLE SODIUM 40 MG PO TBEC: 40.0000 mg | DELAYED_RELEASE_TABLET | Freq: Every day | ORAL | Status: AC

## 2014-10-13 ENCOUNTER — Other Ambulatory Visit: Payer: Self-pay | Admitting: Internal Medicine

## 2015-12-29 DIAGNOSIS — L814 Other melanin hyperpigmentation: Secondary | ICD-10-CM | POA: Diagnosis not present

## 2015-12-29 DIAGNOSIS — D225 Melanocytic nevi of trunk: Secondary | ICD-10-CM | POA: Diagnosis not present

## 2015-12-29 DIAGNOSIS — D2262 Melanocytic nevi of left upper limb, including shoulder: Secondary | ICD-10-CM | POA: Diagnosis not present

## 2015-12-29 DIAGNOSIS — Z85828 Personal history of other malignant neoplasm of skin: Secondary | ICD-10-CM | POA: Diagnosis not present

## 2015-12-29 DIAGNOSIS — M722 Plantar fascial fibromatosis: Secondary | ICD-10-CM | POA: Diagnosis not present

## 2015-12-29 DIAGNOSIS — D2272 Melanocytic nevi of left lower limb, including hip: Secondary | ICD-10-CM | POA: Diagnosis not present

## 2016-01-07 DIAGNOSIS — M79671 Pain in right foot: Secondary | ICD-10-CM | POA: Diagnosis not present

## 2016-04-22 DIAGNOSIS — E782 Mixed hyperlipidemia: Secondary | ICD-10-CM | POA: Diagnosis not present

## 2016-04-22 DIAGNOSIS — F411 Generalized anxiety disorder: Secondary | ICD-10-CM | POA: Diagnosis not present

## 2016-04-22 DIAGNOSIS — I10 Essential (primary) hypertension: Secondary | ICD-10-CM | POA: Diagnosis not present

## 2016-04-22 DIAGNOSIS — Z5181 Encounter for therapeutic drug level monitoring: Secondary | ICD-10-CM | POA: Diagnosis not present

## 2016-04-22 DIAGNOSIS — G4701 Insomnia due to medical condition: Secondary | ICD-10-CM | POA: Diagnosis not present

## 2016-04-22 DIAGNOSIS — E663 Overweight: Secondary | ICD-10-CM | POA: Diagnosis not present

## 2016-04-22 DIAGNOSIS — Z79899 Other long term (current) drug therapy: Secondary | ICD-10-CM | POA: Diagnosis not present

## 2016-04-22 DIAGNOSIS — K219 Gastro-esophageal reflux disease without esophagitis: Secondary | ICD-10-CM | POA: Diagnosis not present

## 2016-04-22 DIAGNOSIS — Z6828 Body mass index (BMI) 28.0-28.9, adult: Secondary | ICD-10-CM | POA: Diagnosis not present

## 2016-06-30 ENCOUNTER — Emergency Department (HOSPITAL_COMMUNITY)
Admission: EM | Admit: 2016-06-30 | Discharge: 2016-06-30 | Disposition: A | Discharge status: Home / Self Care | Payer: 59 | Attending: Emergency Medicine | Admitting: Emergency Medicine

## 2016-06-30 ENCOUNTER — Encounter (HOSPITAL_COMMUNITY): Payer: Self-pay | Admitting: Emergency Medicine

## 2016-06-30 ENCOUNTER — Emergency Department (HOSPITAL_COMMUNITY): Payer: 59

## 2016-06-30 DIAGNOSIS — I10 Essential (primary) hypertension: Secondary | ICD-10-CM | POA: Diagnosis not present

## 2016-06-30 DIAGNOSIS — R509 Fever, unspecified: Secondary | ICD-10-CM | POA: Insufficient documentation

## 2016-06-30 DIAGNOSIS — Z91040 Latex allergy status: Secondary | ICD-10-CM | POA: Diagnosis not present

## 2016-06-30 DIAGNOSIS — R51 Headache: Secondary | ICD-10-CM | POA: Insufficient documentation

## 2016-06-30 DIAGNOSIS — Z79899 Other long term (current) drug therapy: Secondary | ICD-10-CM | POA: Insufficient documentation

## 2016-06-30 DIAGNOSIS — R519 Headache, unspecified: Secondary | ICD-10-CM

## 2016-06-30 LAB — CBC WITH DIFFERENTIAL/PLATELET
Basophils Absolute: 0 10*3/uL (ref 0.0–0.1)
Basophils Relative: 0 %
Eosinophils Absolute: 0.1 10*3/uL (ref 0.0–0.7)
Eosinophils Relative: 2 %
HCT: 38.8 % (ref 36.0–46.0)
Hemoglobin: 13.4 g/dL (ref 12.0–15.0)
Lymphocytes Relative: 34 %
Lymphs Abs: 1 10*3/uL (ref 0.7–4.0)
MCH: 30.6 pg (ref 26.0–34.0)
MCHC: 34.5 g/dL (ref 30.0–36.0)
MCV: 88.6 fL (ref 78.0–100.0)
Monocytes Absolute: 0.4 10*3/uL (ref 0.1–1.0)
Monocytes Relative: 13 %
Neutro Abs: 1.5 10*3/uL — ABNORMAL LOW (ref 1.7–7.7)
Neutrophils Relative %: 51 %
Platelets: 131 10*3/uL — ABNORMAL LOW (ref 150–400)
RBC: 4.38 MIL/uL (ref 3.87–5.11)
RDW: 13 % (ref 11.5–15.5)
WBC: 2.9 10*3/uL — ABNORMAL LOW (ref 4.0–10.5)

## 2016-06-30 LAB — URINALYSIS, ROUTINE W REFLEX MICROSCOPIC
Bilirubin Urine: NEGATIVE
Glucose, UA: NEGATIVE mg/dL
Ketones, ur: NEGATIVE mg/dL
Leukocytes, UA: NEGATIVE
Nitrite: NEGATIVE
Protein, ur: NEGATIVE mg/dL
Specific Gravity, Urine: 1.011 (ref 1.005–1.030)
pH: 6.5 (ref 5.0–8.0)

## 2016-06-30 LAB — COMPREHENSIVE METABOLIC PANEL
ALT: 39 U/L (ref 14–54)
AST: 34 U/L (ref 15–41)
Albumin: 4.1 g/dL (ref 3.5–5.0)
Alkaline Phosphatase: 106 U/L (ref 38–126)
Anion gap: 11 (ref 5–15)
BUN: 7 mg/dL (ref 6–20)
CO2: 25 mmol/L (ref 22–32)
Calcium: 9.2 mg/dL (ref 8.9–10.3)
Chloride: 102 mmol/L (ref 101–111)
Creatinine, Ser: 0.75 mg/dL (ref 0.44–1.00)
GFR calc Af Amer: >60 mL/min (ref >60)
GFR calc non Af Amer: >60 mL/min (ref >60)
Glucose, Bld: 108 mg/dL — ABNORMAL HIGH (ref 65–99)
Potassium: 2.9 mmol/L — ABNORMAL LOW (ref 3.5–5.1)
Sodium: 138 mmol/L (ref 135–145)
Total Bilirubin: 1.1 mg/dL (ref 0.3–1.2)
Total Protein: 6.3 g/dL — ABNORMAL LOW (ref 6.5–8.1)

## 2016-06-30 LAB — URINE MICROSCOPIC-ADD ON: RBC / HPF: NONE SEEN RBC/hpf (ref 0–5)

## 2016-06-30 LAB — LIPASE, BLOOD: Lipase: 27 U/L (ref 11–51)

## 2016-06-30 MED ORDER — POTASSIUM CHLORIDE CRYS ER 20 MEQ PO TBCR
40.0000 meq | EXTENDED_RELEASE_TABLET | Freq: Once | ORAL | Status: AC
  Administered 2016-06-30: 40 meq via ORAL
  Filled 2016-06-30: qty 2

## 2016-06-30 NOTE — ED Triage Notes (Signed)
Pt states she has had a intermittent headache with fever since Tuesday am. Pt also reports dizziness.

## 2016-06-30 NOTE — ED Provider Notes (Signed)
[facility] DEPT Provider Note   CSN: [id number] Arrival date & time: 06/30/16  1515   History   Chief Complaint Chief Complaint  Patient presents with  . Headache    HPI Whitney [name] is a 60 y.o. female.  The history is provided by the patient, medical records and the spouse.   60 year old female with history of hypertension, anxiety/depression, GERD presenting with fever. Onset 2 days ago. Waxing and waning since then. At highest up to nearly 102. Alleviated by taking Excedrin Migraine. Associated with gradual onset, mild to moderate, bifrontal headache that is nonradiating and described as pressure. Sometimes has an occasional pain in her left side, which is brought on by position changes or very deep inspiration, but is not persistently present. Occasionally has some nausea and lightheadedness with exertion when she is experiencing the headache. No headache currently. No neck stiffness or pain. Denies dysuria, hematuria, frequency, vaginal discharge or bleeding, abdominal pain, vomiting, diarrhea, rhinorrhea, cough, SOB, CP. No vision changes, weakness, numbness. Works here in ED as Network engineer but otherwise does not have sick contacts, no recent tick bites or rashes.     Past Medical History:  Diagnosis Date  . Arthritis   . Basal cell carcinoma of face   . Depression   . GAD (generalized anxiety disorder)   . Hypertension   . Insomnia     Patient Active Problem List   Diagnosis Date Noted  . Multiple pulmonary nodules 05/22/2013  . DOE (dyspnea on exertion) 05/21/2013    Past Surgical History:  Procedure Laterality Date  . CESAREAN SECTION     x2  . DILATION AND CURETTAGE OF UTERUS    . TENDON TRANSFER  08/30/2012   Procedure: TENDON TRANSFER;  Surgeon: Roseanne Kaufman, MD;  Location: [facility];  Service: Orthopedics;  Laterality: Left;  LEFT THUMB EXTENSOR INDICUS POLLICUS TO EXTENSOR POLLICUS LONGUS TENDON TRANSFER GENERAL WITH BLOCK  . WISDOM  TOOTH EXTRACTION      OB History    No data available       Home Medications    Prior to Admission medications   Medication Sig Start Date End Date Taking? Authorizing Provider  amLODipine (NORVASC) 5 MG tablet Take 5 mg by mouth at bedtime.   Yes Historical Provider, MD  famotidine (PEPCID) 20 MG tablet Take 20 mg by mouth at bedtime.   Yes Historical Provider, MD  metoprolol (LOPRESSOR) 100 MG tablet Take 100 mg by mouth at bedtime.   Yes Historical Provider, MD  pantoprazole (PROTONIX) 40 MG tablet Take 1 tablet (40 mg total) by mouth daily. Take 30-60 min before first meal of the day 09/17/14  Yes Tanda Rockers, MD  famotidine (PEPCID) 20 MG tablet TAKE 1 TABLET BY MOUTH ONCE DAILY AT BEDTIME Patient not taking: Reported on 06/30/2016 09/02/14   Tanda Rockers, MD  metoprolol succinate (TOPROL-XL) 100 MG 24 hr tablet One half in am and one half pm Patient not taking: Reported on 06/30/2016 05/21/13   Tanda Rockers, MD    Family History Family History  Problem Relation Age of Onset  . Lung cancer Father     smoker    Social History Social History  Substance Use Topics  . Smoking status: Never Smoker  . Smokeless tobacco: Never Used  . Alcohol use Yes     Comment: occ     Allergies   Latex   Review of Systems Review of Systems  Constitutional: Positive for fever.  HENT:  Negative for ear pain, rhinorrhea and sore throat.   Eyes: Negative for visual disturbance.  Respiratory: Negative for cough and shortness of breath.   Cardiovascular: Negative for chest pain.  Gastrointestinal: Positive for nausea. Negative for abdominal pain, constipation, diarrhea and vomiting.  Genitourinary: Positive for flank pain. Negative for dysuria, frequency, hematuria, vaginal bleeding and vaginal discharge.  Musculoskeletal: Negative for neck pain and neck stiffness.  Skin: Negative for rash and wound.  Allergic/Immunologic: Negative for immunocompromised state.  Neurological:  Positive for light-headedness and headaches. Negative for weakness and numbness.  Hematological: Does not bruise/bleed easily.  Psychiatric/Behavioral: Negative for confusion.  All other systems reviewed and are negative.    Physical Exam Updated Vital Signs BP 134/83   Pulse 87   Temp 97.9 F (36.6 C) (Oral)   Resp 16   Ht 5\' 6"  (1.676 m)   SpO2 96%   Physical Exam  Constitutional: She is oriented to person, place, and time. She appears well-developed and well-nourished. No distress.  HENT:  Head: Normocephalic and atraumatic.  bilat TM clear   Eyes: Conjunctivae are normal.  Neck: Normal range of motion. Neck supple.  No meningismus   Cardiovascular: Normal rate and regular rhythm.   No murmur heard. Pulmonary/Chest: Effort normal and breath sounds normal. No respiratory distress.  Abdominal: Soft. There is no tenderness.  No CVAT   Musculoskeletal: She exhibits no edema.  Neurological: She is alert and oriented to person, place, and time. She has normal strength and normal reflexes. No cranial nerve deficit or sensory deficit. She exhibits normal muscle tone. Coordination (normal FTN, rapid hand alternating, HTS) and gait (normal routine and tandem) normal. GCS eye subscore is 4. GCS verbal subscore is 5. GCS motor subscore is 6.  Skin: Skin is warm and dry.  Psychiatric: She has a normal mood and affect.  Nursing note and vitals reviewed.   ED Treatments / Results  Labs (all labs ordered are listed, but only abnormal results are displayed) Labs Reviewed  URINALYSIS, ROUTINE W REFLEX MICROSCOPIC (NOT AT [facility]) - Abnormal; Notable for the following:       Result Value   Hgb urine dipstick TRACE (*)    All other components within normal limits  COMPREHENSIVE METABOLIC PANEL - Abnormal; Notable for the following:    Potassium 2.9 (*)    Glucose, Bld 108 (*)    Total Protein 6.3 (*)    All other components within normal limits  CBC WITH DIFFERENTIAL/PLATELET -  Abnormal; Notable for the following:    WBC 2.9 (*)    Platelets 131 (*)    Neutro Abs 1.5 (*)    All other components within normal limits  URINE MICROSCOPIC-ADD ON - Abnormal; Notable for the following:    Squamous Epithelial / LPF 0-5 (*)    Bacteria, UA RARE (*)    All other components within normal limits  LIPASE, BLOOD    EKG  EKG Interpretation None       Radiology Dg Chest 2 View  Result Date: 06/30/2016 CLINICAL DATA:  Fever EXAM: CHEST  2 VIEW COMPARISON:  April 15, 2014 chest CT FINDINGS: There is no edema or consolidation. The pulmonary nodular lesions seen on the prior CT study are not appreciable by radiography. Heart size and pulmonary vascularity are normal. No adenopathy. There is degenerative change in each shoulder and in the thoracic spine. IMPRESSION: No edema or consolidation. Electronically Signed   By: Lowella Grip III M.D.   On: 06/30/2016 16:20  Procedures Procedures (including critical care time)  Medications Ordered in ED Medications  potassium chloride SA (K-DUR,KLOR-CON) CR tablet 40 mEq (40 mEq Oral Given 06/30/16 1805)     Initial Impression / Assessment and Plan / ED Course  I have reviewed the triage vital signs and the nursing notes.  Pertinent labs & imaging results that were available during my care of the patient were reviewed by me and considered in my medical decision making (see chart for details).  Clinical Course    60 year old female with history of hypertension, anxiety/depression, GERD presenting with fever as above, with waxing and waning mild bifrontal HA, occasional left side pain, and sometimes mild nausea and lightheadedness with position change and exertion. Here afebrile initially (on recheck borderline temp at 100.3), mildly hypertensive, otherwise vital signs are stable and patient is well appearing with minimal symptoms currently. No neurologic deficits present.   Chest x-ray, urine, basic labs obtained.  Unremarkable for acute infectious process. Does have hypokalemia, given dose of potassium here. Do not feel patient has any meningismus or evidence of meningitis at this time. Do not feel she needs an LP. Discussed return precautions for any worsening of her symptoms and supportive care at home. Discharged in stable condition.  Case discussed with Dr. Vanita Panda, who oversaw management of this patient.   Final Clinical Impressions(s) / ED Diagnoses   Final diagnoses:  Acute nonintractable headache, unspecified headache type  Fever, unspecified fever cause    New Prescriptions Discharge Medication List as of 06/30/2016  5:32 PM       Ivin Booty, MD 06/30/16 [id number]    Carmin Muskrat, MD 06/30/16 [phone]

## 2016-07-22 DIAGNOSIS — J209 Acute bronchitis, unspecified: Secondary | ICD-10-CM | POA: Diagnosis not present

## 2016-07-27 ENCOUNTER — Ambulatory Visit
Admission: RE | Admit: 2016-07-27 | Discharge: 2016-07-27 | Disposition: A | Discharge status: Home / Self Care | Payer: 59 | Source: Ambulatory Visit | Attending: Family Medicine | Admitting: Family Medicine

## 2016-07-27 ENCOUNTER — Other Ambulatory Visit: Payer: Self-pay | Admitting: Family Medicine

## 2016-07-27 DIAGNOSIS — R05 Cough: Secondary | ICD-10-CM | POA: Diagnosis not present

## 2016-07-27 DIAGNOSIS — B9789 Other viral agents as the cause of diseases classified elsewhere: Secondary | ICD-10-CM | POA: Diagnosis not present

## 2016-07-27 DIAGNOSIS — J189 Pneumonia, unspecified organism: Secondary | ICD-10-CM

## 2016-07-27 DIAGNOSIS — R079 Chest pain, unspecified: Secondary | ICD-10-CM | POA: Diagnosis not present

## 2016-07-27 DIAGNOSIS — R0602 Shortness of breath: Secondary | ICD-10-CM | POA: Diagnosis not present

## 2016-07-27 DIAGNOSIS — R7301 Impaired fasting glucose: Secondary | ICD-10-CM | POA: Diagnosis not present

## 2016-07-27 HISTORY — DX: Pneumonia, unspecified organism: J18.9

## 2016-07-28 DIAGNOSIS — J069 Acute upper respiratory infection, unspecified: Secondary | ICD-10-CM | POA: Diagnosis not present

## 2016-07-28 DIAGNOSIS — N950 Postmenopausal bleeding: Secondary | ICD-10-CM | POA: Diagnosis not present

## 2016-08-01 DIAGNOSIS — R1031 Right lower quadrant pain: Secondary | ICD-10-CM | POA: Diagnosis not present

## 2016-08-01 DIAGNOSIS — D259 Leiomyoma of uterus, unspecified: Secondary | ICD-10-CM | POA: Diagnosis not present

## 2016-08-01 DIAGNOSIS — J069 Acute upper respiratory infection, unspecified: Secondary | ICD-10-CM | POA: Diagnosis not present

## 2016-09-29 NOTE — Patient Instructions (Addendum)
Your procedure is scheduled on:  Tuesday, Dec. 12, 2017  Enter through the [employer] [facility] at:  6:00 AM  Pick up the phone at the desk and dial [phone].  Call this number if you have problems the morning of surgery: [phone].  Remember: Do NOT eat food or drink after:  Midnight Monday, Dec. 11, 2017  Take these medicines the morning of surgery with a SIP OF WATER:  Protonix  Stop ALL herbal medications at this time   Do NOT wear jewelry (body piercing), metal hair clips/bobby pins, make-up, or nail polish. Do NOT wear lotions, powders, or perfumes.  You may wear deodorant. Do NOT shave for 48 hours prior to surgery. Do NOT bring valuables to the hospital. Contacts, dentures, or bridgework may not be worn into surgery.  Leave suitcase in car.  After surgery it may be brought to your room.  For patients admitted to the hospital, checkout time is 11:00 AM the day of discharge.

## 2016-10-04 ENCOUNTER — Encounter (HOSPITAL_COMMUNITY): Payer: Self-pay

## 2016-10-04 ENCOUNTER — Encounter (HOSPITAL_COMMUNITY)
Admission: RE | Admit: 2016-10-04 | Discharge: 2016-10-04 | Disposition: A | Discharge status: Home / Self Care | Payer: 59 | Source: Ambulatory Visit | Attending: Obstetrics & Gynecology | Admitting: Obstetrics & Gynecology

## 2016-10-04 DIAGNOSIS — Z01810 Encounter for preprocedural cardiovascular examination: Secondary | ICD-10-CM | POA: Diagnosis not present

## 2016-10-04 DIAGNOSIS — N950 Postmenopausal bleeding: Secondary | ICD-10-CM | POA: Insufficient documentation

## 2016-10-04 DIAGNOSIS — N958 Other specified menopausal and perimenopausal disorders: Secondary | ICD-10-CM | POA: Diagnosis not present

## 2016-10-04 DIAGNOSIS — Z01812 Encounter for preprocedural laboratory examination: Secondary | ICD-10-CM | POA: Insufficient documentation

## 2016-10-04 HISTORY — DX: Pneumonia, unspecified organism: J18.9

## 2016-10-04 HISTORY — DX: Gastro-esophageal reflux disease without esophagitis: K21.9

## 2016-10-04 LAB — COMPREHENSIVE METABOLIC PANEL
ALT: 35 U/L (ref 14–54)
AST: 25 U/L (ref 15–41)
Albumin: 4.6 g/dL (ref 3.5–5.0)
Alkaline Phosphatase: 111 U/L (ref 38–126)
Anion gap: 9 (ref 5–15)
BUN: 14 mg/dL (ref 6–20)
CO2: 27 mmol/L (ref 22–32)
Calcium: 9.4 mg/dL (ref 8.9–10.3)
Chloride: 104 mmol/L (ref 101–111)
Creatinine, Ser: 0.62 mg/dL (ref 0.44–1.00)
GFR calc Af Amer: >60 mL/min (ref >60)
GFR calc non Af Amer: >60 mL/min (ref >60)
Glucose, Bld: 93 mg/dL (ref 65–99)
Potassium: 3.1 mmol/L — ABNORMAL LOW (ref 3.5–5.1)
Sodium: 140 mmol/L (ref 135–145)
Total Bilirubin: 0.8 mg/dL (ref 0.3–1.2)
Total Protein: 7.3 g/dL (ref 6.5–8.1)

## 2016-10-04 LAB — CBC WITH DIFFERENTIAL/PLATELET
Basophils Absolute: 0 10*3/uL (ref 0.0–0.1)
Basophils Relative: 1 %
Eosinophils Absolute: 0.1 10*3/uL (ref 0.0–0.7)
Eosinophils Relative: 2 %
HCT: 39.9 % (ref 36.0–46.0)
Hemoglobin: 14.1 g/dL (ref 12.0–15.0)
Lymphocytes Relative: 32 %
Lymphs Abs: 2 10*3/uL (ref 0.7–4.0)
MCH: 30.9 pg (ref 26.0–34.0)
MCHC: 35.3 g/dL (ref 30.0–36.0)
MCV: 87.3 fL (ref 78.0–100.0)
Monocytes Absolute: 0.2 10*3/uL (ref 0.1–1.0)
Monocytes Relative: 4 %
Neutro Abs: 3.7 10*3/uL (ref 1.7–7.7)
Neutrophils Relative %: 61 %
Platelets: 211 10*3/uL (ref 150–400)
RBC: 4.57 MIL/uL (ref 3.87–5.11)
RDW: 13.4 % (ref 11.5–15.5)
WBC: 6.1 10*3/uL (ref 4.0–10.5)

## 2016-10-04 LAB — TYPE AND SCREEN
ABO/RH(D): O POS
Antibody Screen: NEGATIVE

## 2016-10-04 LAB — ABO/RH: ABO/RH(D): O POS

## 2016-10-09 NOTE — H&P (Signed)
Whitney [name] is an 60 y.o. female with recurrent post-menopausal bleeding; no h/o HRT use.  She has had three endometrial biopsies since menopause; most recent this month; all benign.Ultrasound shows 168 cc uterus with endometrial stripe measuring 7.74mm; adnexae clear. The patient declines conservative management with observation and/or progestin treatment as well as D&C. She desires definitive therapy with hysterectomy bilateral salpingo-oophorectomy. In counseling for hysterectomy, the patient wants abdominal hyst to utilize her existing Pfannenstiel incision from her two prior C/S.  Pertinent Gynecological History: Menses: flow is light Bleeding: postmenopausal bleeding Contraception: post menopausal status DES exposure: unknown Blood transfusions: none Sexually transmitted diseases: no past history Previous GYN Procedures: C/S x 2  Last mammogram: normal Date: 10/2015 Last pap: normal Date: 10/2015 OB History: G3, P2   Menstrual History: Menarche age: n/a No LMP recorded. Patient is postmenopausal.    Past Medical History:  Diagnosis Date  . Arthritis    hands  . Basal cell carcinoma of face   . Depression   . GAD (generalized anxiety disorder)   . GERD (gastroesophageal reflux disease)   . Hypertension   . Insomnia   . Pneumonia 07/27/2016   Bacterial    Past Surgical History:  Procedure Laterality Date  . CESAREAN SECTION     x2  . COLONOSCOPY    . DILATION AND CURETTAGE OF UTERUS    . TENDON TRANSFER  08/30/2012   Procedure: TENDON TRANSFER;  Surgeon: Roseanne Kaufman, MD;  Location: [facility];  Service: Orthopedics;  Laterality: Left;  LEFT THUMB EXTENSOR INDICUS POLLICUS TO EXTENSOR POLLICUS LONGUS TENDON TRANSFER GENERAL WITH BLOCK  . WISDOM TOOTH EXTRACTION      Family History  Problem Relation Age of Onset  . Lung cancer Father     smoker    Social History:  reports that she has never smoked. She has never used smokeless tobacco. She  reports that she drinks alcohol. She reports that she does not use drugs.  Allergies:  Allergies  Allergen Reactions  . Latex Rash    No prescriptions prior to admission.    ROS  There were no vitals taken for this visit. Physical Exam  Constitutional: She is oriented to person, place, and time. She appears well-developed and well-nourished.  Cardiovascular: Normal rate and regular rhythm.   Respiratory: Effort normal and breath sounds normal.  GI: Soft. There is no rebound and no guarding.  Neurological: She is alert and oriented to person, place, and time.  Skin: Skin is warm and dry.    No results found for this or any previous visit (from the past 24 hour(s)).  No results found.  Assessment/Plan: 60yo G3P2 with recurrent postmenopausal bleeding -TAH, BSO -Patient is counseled re: risk of bleeding, infection, scarring and damage to surrounding structures.  All questions were answered and the patient wishes to proceed.  Tykel Badie, [place] 10/09/2016, 6:04 PM

## 2016-10-11 ENCOUNTER — Inpatient Hospital Stay (HOSPITAL_COMMUNITY)
Admission: RE | Admit: 2016-10-11 | Discharge: 2016-10-13 | DRG: 743 | Disposition: A | Discharge status: Home / Self Care | Payer: 59 | Source: Ambulatory Visit | Attending: Obstetrics & Gynecology | Admitting: Obstetrics & Gynecology

## 2016-10-11 ENCOUNTER — Encounter (HOSPITAL_COMMUNITY): Payer: Self-pay

## 2016-10-11 ENCOUNTER — Encounter (HOSPITAL_COMMUNITY)
Admission: RE | Disposition: A | Discharge status: Home / Self Care | Payer: Self-pay | Source: Ambulatory Visit | Attending: Obstetrics & Gynecology

## 2016-10-11 ENCOUNTER — Inpatient Hospital Stay (HOSPITAL_COMMUNITY): Payer: 59 | Admitting: Anesthesiology

## 2016-10-11 DIAGNOSIS — D259 Leiomyoma of uterus, unspecified: Secondary | ICD-10-CM | POA: Diagnosis not present

## 2016-10-11 DIAGNOSIS — K219 Gastro-esophageal reflux disease without esophagitis: Secondary | ICD-10-CM | POA: Diagnosis present

## 2016-10-11 DIAGNOSIS — Z90710 Acquired absence of both cervix and uterus: Secondary | ICD-10-CM | POA: Diagnosis present

## 2016-10-11 DIAGNOSIS — D251 Intramural leiomyoma of uterus: Secondary | ICD-10-CM | POA: Diagnosis not present

## 2016-10-11 DIAGNOSIS — N840 Polyp of corpus uteri: Secondary | ICD-10-CM | POA: Diagnosis not present

## 2016-10-11 DIAGNOSIS — I10 Essential (primary) hypertension: Secondary | ICD-10-CM | POA: Diagnosis not present

## 2016-10-11 DIAGNOSIS — R911 Solitary pulmonary nodule: Secondary | ICD-10-CM | POA: Diagnosis not present

## 2016-10-11 DIAGNOSIS — N950 Postmenopausal bleeding: Principal | ICD-10-CM | POA: Diagnosis present

## 2016-10-11 DIAGNOSIS — R1031 Right lower quadrant pain: Secondary | ICD-10-CM | POA: Diagnosis not present

## 2016-10-11 DIAGNOSIS — R0609 Other forms of dyspnea: Secondary | ICD-10-CM | POA: Diagnosis not present

## 2016-10-11 HISTORY — PX: SALPINGOOPHORECTOMY: SHX82

## 2016-10-11 HISTORY — PX: ABDOMINAL HYSTERECTOMY: SHX81

## 2016-10-11 SURGERY — HYSTERECTOMY, ABDOMINAL
Anesthesia: General | Site: Abdomen

## 2016-10-11 MED ORDER — HYDROMORPHONE HCL 1 MG/ML IJ SOLN
INTRAMUSCULAR | Status: AC
  Filled 2016-10-11: qty 1

## 2016-10-11 MED ORDER — SODIUM CHLORIDE 0.9% FLUSH: 9.0000 mL | INTRAVENOUS | Status: DC | PRN

## 2016-10-11 MED ORDER — MIDAZOLAM HCL 2 MG/2ML IJ SOLN
INTRAMUSCULAR | Status: DC | PRN
  Administered 2016-10-11: 2 mg via INTRAVENOUS

## 2016-10-11 MED ORDER — HYDROMORPHONE HCL 1 MG/ML IJ SOLN
0.2500 mg | INTRAMUSCULAR | Status: DC | PRN
  Administered 2016-10-11 (×2): 0.5 mg via INTRAVENOUS
  Administered 2016-10-11: 0.25 mg via INTRAVENOUS
  Administered 2016-10-11: 0.5 mg via INTRAVENOUS
  Administered 2016-10-11: 0.25 mg via INTRAVENOUS
  Administered 2016-10-11 (×2): 0.5 mg via INTRAVENOUS

## 2016-10-11 MED ORDER — FENTANYL CITRATE (PF) 250 MCG/5ML IJ SOLN
INTRAMUSCULAR | Status: AC
  Filled 2016-10-11: qty 5

## 2016-10-11 MED ORDER — CEFAZOLIN SODIUM-DEXTROSE 2-4 GM/100ML-% IV SOLN
2.0000 g | INTRAVENOUS | Status: AC
Start: 2016-10-11 — End: 2016-10-11
  Administered 2016-10-11: 2 g via INTRAVENOUS

## 2016-10-11 MED ORDER — ONDANSETRON HCL 4 MG/2ML IJ SOLN: 4.0000 mg | Freq: Four times a day (QID) | INTRAMUSCULAR | Status: DC | PRN

## 2016-10-11 MED ORDER — LACTATED RINGERS IV SOLN
INTRAVENOUS | Status: DC
  Administered 2016-10-11 (×2): via INTRAVENOUS

## 2016-10-11 MED ORDER — HYDROMORPHONE HCL 1 MG/ML IJ SOLN
INTRAMUSCULAR | Status: AC
  Administered 2016-10-11: 0.25 mg via INTRAVENOUS
  Filled 2016-10-11: qty 1

## 2016-10-11 MED ORDER — ROCURONIUM BROMIDE 100 MG/10ML IV SOLN
INTRAVENOUS | Status: AC
  Filled 2016-10-11: qty 1

## 2016-10-11 MED ORDER — KETOROLAC TROMETHAMINE 30 MG/ML IJ SOLN
INTRAMUSCULAR | Status: AC
  Filled 2016-10-11: qty 1

## 2016-10-11 MED ORDER — HYDROMORPHONE HCL 1 MG/ML IJ SOLN: 0.2000 mg | INTRAMUSCULAR | Status: DC | PRN

## 2016-10-11 MED ORDER — GLYCOPYRROLATE 0.2 MG/ML IJ SOLN
INTRAMUSCULAR | Status: AC
  Filled 2016-10-11: qty 1

## 2016-10-11 MED ORDER — DEXAMETHASONE SODIUM PHOSPHATE 4 MG/ML IJ SOLN
INTRAMUSCULAR | Status: AC
Start: 2016-10-11 — End: 2016-10-11
  Filled 2016-10-11: qty 1

## 2016-10-11 MED ORDER — OXYCODONE-ACETAMINOPHEN 5-325 MG PO TABS
1.0000 | ORAL_TABLET | ORAL | Status: DC | PRN
  Administered 2016-10-12 – 2016-10-13 (×5): 2 via ORAL
  Filled 2016-10-11 (×5): qty 2

## 2016-10-11 MED ORDER — SCOPOLAMINE 1 MG/3DAYS TD PT72
MEDICATED_PATCH | TRANSDERMAL | Status: AC
  Administered 2016-10-11: 1.5 mg via TRANSDERMAL
  Filled 2016-10-11: qty 1

## 2016-10-11 MED ORDER — MIDAZOLAM HCL 2 MG/2ML IJ SOLN
INTRAMUSCULAR | Status: AC
  Filled 2016-10-11: qty 2

## 2016-10-11 MED ORDER — NALOXONE HCL 0.4 MG/ML IJ SOLN: 0.4000 mg | INTRAMUSCULAR | Status: DC | PRN

## 2016-10-11 MED ORDER — DIPHENHYDRAMINE HCL 12.5 MG/5ML PO ELIX: 12.5000 mg | ORAL_SOLUTION | Freq: Four times a day (QID) | ORAL | Status: DC | PRN

## 2016-10-11 MED ORDER — ONDANSETRON HCL 4 MG/2ML IJ SOLN
INTRAMUSCULAR | Status: DC | PRN
  Administered 2016-10-11: 4 mg via INTRAVENOUS

## 2016-10-11 MED ORDER — GLYCOPYRROLATE 0.2 MG/ML IJ SOLN
INTRAMUSCULAR | Status: DC | PRN
  Administered 2016-10-11: 0.2 mg via INTRAVENOUS

## 2016-10-11 MED ORDER — METOPROLOL SUCCINATE ER 100 MG PO TB24
100.0000 mg | ORAL_TABLET | Freq: Every day | ORAL | Status: DC
  Administered 2016-10-11 – 2016-10-12 (×2): 100 mg via ORAL
  Filled 2016-10-11 (×2): qty 1

## 2016-10-11 MED ORDER — LACTATED RINGERS IV SOLN
INTRAVENOUS | Status: DC
  Administered 2016-10-11 (×3): via INTRAVENOUS

## 2016-10-11 MED ORDER — ONDANSETRON HCL 4 MG PO TABS: 4.0000 mg | ORAL_TABLET | Freq: Four times a day (QID) | ORAL | Status: DC | PRN

## 2016-10-11 MED ORDER — SUGAMMADEX SODIUM 200 MG/2ML IV SOLN
INTRAVENOUS | Status: DC | PRN
  Administered 2016-10-11: 200 mg via INTRAVENOUS

## 2016-10-11 MED ORDER — HYDROMORPHONE HCL 1 MG/ML IJ SOLN
INTRAMUSCULAR | Status: AC
  Administered 2016-10-11: 0.5 mg via INTRAVENOUS
  Filled 2016-10-11: qty 1

## 2016-10-11 MED ORDER — SCOPOLAMINE 1 MG/3DAYS TD PT72
1.0000 | MEDICATED_PATCH | Freq: Once | TRANSDERMAL | Status: DC
  Administered 2016-10-11: 1.5 mg via TRANSDERMAL

## 2016-10-11 MED ORDER — LIDOCAINE HCL (CARDIAC) 20 MG/ML IV SOLN
INTRAVENOUS | Status: DC | PRN
  Administered 2016-10-11: 60 mg via INTRAVENOUS

## 2016-10-11 MED ORDER — MENTHOL 3 MG MT LOZG: 1.0000 | LOZENGE | OROMUCOSAL | Status: DC | PRN

## 2016-10-11 MED ORDER — KETOROLAC TROMETHAMINE 30 MG/ML IJ SOLN
30.0000 mg | Freq: Four times a day (QID) | INTRAMUSCULAR | Status: DC
  Administered 2016-10-11 – 2016-10-12 (×3): 30 mg via INTRAVENOUS
  Filled 2016-10-11 (×2): qty 1

## 2016-10-11 MED ORDER — DIPHENHYDRAMINE HCL 50 MG/ML IJ SOLN: 12.5000 mg | Freq: Four times a day (QID) | INTRAMUSCULAR | Status: DC | PRN

## 2016-10-11 MED ORDER — SIMETHICONE 80 MG PO CHEW: 80.0000 mg | CHEWABLE_TABLET | Freq: Four times a day (QID) | ORAL | Status: DC | PRN

## 2016-10-11 MED ORDER — MEPERIDINE HCL 25 MG/ML IJ SOLN: 6.2500 mg | INTRAMUSCULAR | Status: DC | PRN

## 2016-10-11 MED ORDER — FENTANYL CITRATE (PF) 100 MCG/2ML IJ SOLN
INTRAMUSCULAR | Status: DC | PRN
  Administered 2016-10-11: 50 ug via INTRAVENOUS
  Administered 2016-10-11 (×2): 100 ug via INTRAVENOUS

## 2016-10-11 MED ORDER — LACTATED RINGERS IV SOLN
INTRAVENOUS | Status: DC
  Administered 2016-10-11: 125 mL/h via INTRAVENOUS

## 2016-10-11 MED ORDER — LIDOCAINE HCL (CARDIAC) 20 MG/ML IV SOLN
INTRAVENOUS | Status: AC
  Filled 2016-10-11: qty 5

## 2016-10-11 MED ORDER — PROPOFOL 10 MG/ML IV BOLUS
INTRAVENOUS | Status: DC | PRN
Start: 2016-10-11 — End: 2016-10-11
  Administered 2016-10-11: 200 mg via INTRAVENOUS

## 2016-10-11 MED ORDER — ROCURONIUM BROMIDE 100 MG/10ML IV SOLN
INTRAVENOUS | Status: DC | PRN
  Administered 2016-10-11: 10 mg via INTRAVENOUS
  Administered 2016-10-11: 40 mg via INTRAVENOUS

## 2016-10-11 MED ORDER — DOCUSATE SODIUM 100 MG PO CAPS
100.0000 mg | ORAL_CAPSULE | Freq: Two times a day (BID) | ORAL | Status: DC
  Administered 2016-10-11 – 2016-10-12 (×4): 100 mg via ORAL
  Filled 2016-10-11 (×4): qty 1

## 2016-10-11 MED ORDER — DEXAMETHASONE SODIUM PHOSPHATE 10 MG/ML IJ SOLN
INTRAMUSCULAR | Status: DC | PRN
  Administered 2016-10-11: 4 mg via INTRAVENOUS

## 2016-10-11 MED ORDER — ONDANSETRON HCL 4 MG/2ML IJ SOLN
INTRAMUSCULAR | Status: AC
  Filled 2016-10-11: qty 2

## 2016-10-11 MED ORDER — ONDANSETRON HCL 4 MG/2ML IJ SOLN: 4.0000 mg | Freq: Once | INTRAMUSCULAR | Status: DC | PRN

## 2016-10-11 MED ORDER — 0.9 % SODIUM CHLORIDE (POUR BTL) OPTIME
TOPICAL | Status: DC | PRN
  Administered 2016-10-11: 1000 mL

## 2016-10-11 MED ORDER — KETOROLAC TROMETHAMINE 30 MG/ML IJ SOLN: 30.0000 mg | Freq: Four times a day (QID) | INTRAMUSCULAR | Status: DC

## 2016-10-11 MED ORDER — CITALOPRAM HYDROBROMIDE 10 MG PO TABS
10.0000 mg | ORAL_TABLET | Freq: Every day | ORAL | Status: DC
  Administered 2016-10-11 – 2016-10-12 (×2): 10 mg via ORAL
  Filled 2016-10-11 (×2): qty 1

## 2016-10-11 MED ORDER — HYDROMORPHONE 1 MG/ML IV SOLN
INTRAVENOUS | Status: DC
  Administered 2016-10-11 (×2): 0.6 mg via INTRAVENOUS
  Administered 2016-10-11: 12:00:00 via INTRAVENOUS
  Administered 2016-10-12 (×2): 0.9 mg via INTRAVENOUS
  Filled 2016-10-11: qty 25

## 2016-10-11 MED ORDER — PROPOFOL 10 MG/ML IV BOLUS
INTRAVENOUS | Status: AC
  Filled 2016-10-11: qty 20

## 2016-10-11 MED ORDER — SUGAMMADEX SODIUM 200 MG/2ML IV SOLN
INTRAVENOUS | Status: AC
  Filled 2016-10-11: qty 2

## 2016-10-11 MED ORDER — KETOROLAC TROMETHAMINE 30 MG/ML IJ SOLN
INTRAMUSCULAR | Status: DC | PRN
  Administered 2016-10-11: 30 mg via INTRAVENOUS

## 2016-10-11 MED ORDER — HYDROMORPHONE HCL 1 MG/ML IJ SOLN
INTRAMUSCULAR | Status: DC | PRN
  Administered 2016-10-11: 1 mg via INTRAVENOUS

## 2016-10-11 SURGICAL SUPPLY — 48 items
APL SKNCLS STERI-STRIP NONHPOA (GAUZE/BANDAGES/DRESSINGS) ×2
BENZOIN TINCTURE PRP APPL 2/3 (GAUZE/BANDAGES/DRESSINGS) ×3 IMPLANT
CANISTER SUCT 3000ML (MISCELLANEOUS) ×3 IMPLANT
CELLS DAT CNTRL 66122 CELL SVR (MISCELLANEOUS) IMPLANT
CLOTH BEACON ORANGE TIMEOUT ST (SAFETY) ×3 IMPLANT
CONT PATH 16OZ SNAP LID 3702 (MISCELLANEOUS) IMPLANT
DECANTER SPIKE VIAL GLASS SM (MISCELLANEOUS) IMPLANT
DRAPE WARM FLUID 44X44 (DRAPE) ×3 IMPLANT
DRSG OPSITE POSTOP 4X10 (GAUZE/BANDAGES/DRESSINGS) ×3 IMPLANT
DURAPREP 26ML APPLICATOR (WOUND CARE) ×3 IMPLANT
ELECT LIGASURE LONG (ELECTRODE) IMPLANT
GAUZE SPONGE 4X4 16PLY XRAY LF (GAUZE/BANDAGES/DRESSINGS) IMPLANT
GLOVE BIO SURGEON STRL SZ 6 (GLOVE) IMPLANT
GLOVE BIOGEL PI IND STRL 6 (GLOVE) ×2 IMPLANT
GLOVE BIOGEL PI IND STRL 7.0 (GLOVE) ×4 IMPLANT
GLOVE BIOGEL PI INDICATOR 6 (GLOVE) ×1
GLOVE BIOGEL PI INDICATOR 7.0 (GLOVE) ×2
GLOVE SKINSENSE NS SZ7.0 (GLOVE) ×1
GLOVE SKINSENSE NS SZ8.0 LF (GLOVE) ×1
GLOVE SKINSENSE STRL SZ6.0 (GLOVE) ×3 IMPLANT
GLOVE SKINSENSE STRL SZ7.0 (GLOVE) ×2 IMPLANT
GLOVE SKINSENSE STRL SZ8.0 LF (GLOVE) ×2 IMPLANT
GOWN STRL REUS W/TWL LRG LVL3 (GOWN DISPOSABLE) ×9 IMPLANT
HEMOSTAT SURGICEL 4X8 (HEMOSTASIS) IMPLANT
NS IRRIG 1000ML POUR BTL (IV SOLUTION) ×6 IMPLANT
PACK ABDOMINAL GYN (CUSTOM PROCEDURE TRAY) ×3 IMPLANT
PAD OB MATERNITY 4.3X12.25 (PERSONAL CARE ITEMS) ×3 IMPLANT
PENCIL SMOKE EVAC W/HOLSTER (ELECTROSURGICAL) ×3 IMPLANT
PROTECTOR NERVE ULNAR (MISCELLANEOUS) ×3 IMPLANT
RTRCTR C-SECT PINK 25CM LRG (MISCELLANEOUS) ×3 IMPLANT
RTRCTR WOUND ALEXIS 18CM MED (MISCELLANEOUS)
SPONGE LAP 18X18 X RAY DECT (DISPOSABLE) ×3 IMPLANT
STAPLER VISISTAT 35W (STAPLE) IMPLANT
STRIP CLOSURE SKIN 1/2X4 (GAUZE/BANDAGES/DRESSINGS) ×3 IMPLANT
SUT MNCRL 0 MO-4 VIOLET 18 CR (SUTURE) ×6 IMPLANT
SUT MNCRL 0 VIOLET 6X18 (SUTURE) ×2 IMPLANT
SUT MON AB 2-0 CT1 36 (SUTURE) IMPLANT
SUT MON AB-0 CT1 36 (SUTURE) ×6 IMPLANT
SUT MONOCRYL 0 6X18 (SUTURE) ×1
SUT MONOCRYL 0 MO 4 18  CR/8 (SUTURE) ×3
SUT PDS AB 0 CT1 27 (SUTURE) ×3 IMPLANT
SUT PLAIN 2 0 XLH (SUTURE) IMPLANT
SUT VIC AB 3-0 X1 27 (SUTURE) IMPLANT
SUT VIC AB 4-0 KS 27 (SUTURE) ×3 IMPLANT
TOWEL OR 17X24 6PK STRL BLUE (TOWEL DISPOSABLE) ×6 IMPLANT
TRAY FOLEY BAG SILVER LF 16FR (SET/KITS/TRAYS/PACK) ×3 IMPLANT
TRAY FOLEY CATH SILVER 14FR (SET/KITS/TRAYS/PACK) IMPLANT
WATER STERILE IRR 1000ML POUR (IV SOLUTION) IMPLANT

## 2016-10-11 NOTE — Plan of Care (Signed)
Problem: Tissue Perfusion: Goal: Risk factors for ineffective tissue perfusion will decrease Outcome: Progressing /Ambulated patient in the hallways to decrease ineffective tissue perfusion.  Problem: Activity: Goal: Risk for activity intolerance will decrease Outcome: Completed/Met Date Met: 10/11/16 Out of bed and ambulated in hallways with assistance.  Tolerated well.  Problem: Fluid Volume: Goal: Ability to maintain a balanced intake and output will improve Outcome: Completed/Met Date Met: 10/11/16 Good PO intake with adequate urine output.  Problem: Nutrition: Goal: Adequate nutrition will be maintained Outcome: Completed/Met Date Met: 10/11/16 Tolerating regular diet without any problem.  Problem: Bowel/Gastric: Goal: Will not experience complications related to bowel motility Outcome: Progressing Good bowel sounds, tolerating PO intake.  Still not passing flatus.  Problem: Bowel/Gastric: Goal: Gastrointestinal status for postoperative course will improve Outcome: Progressing GI status postop, advancing well.  Problem: Education: Goal: Knowledge of the prescribed therapeutic regimen will improve Outcome: Completed/Met Date Met: 10/11/16 Knowledgeable about prescribed therapeutic regimen.  Problem: Physical Regulation: Goal: Postoperative complications will be avoided or minimized Outcome: Progressing No postop complications noted.  Problem: Respiratory: Goal: Ability to maintain adequate ventilation will improve Outcome: Completed/Met Date Met: 10/11/16 Lungs clear. Breathing unlabored .  Problem: Skin Integrity: Goal: Demonstration of wound healing without infection will improve Outcome: Progressing Dressing intact and dry, unable to assess incision at this time.  Problem: Urinary Elimination: Goal: Ability to reestablish a normal urinary elimination pattern will improve Outcome: Progressing Foley catheter intact, urine output adequate.

## 2016-10-11 NOTE — Anesthesia Postprocedure Evaluation (Signed)
Anesthesia Post Note  Patient: SADIYAH THEILEN  Procedure(s) Performed: Procedure(s) (LRB): HYSTERECTOMY TOTAL  ABDOMINAL (N/A) BILATERAL SALPINGO OOPHORECTOMY (Bilateral)  Patient location during evaluation: PACU Anesthesia Type: General Level of consciousness: awake and alert Pain management: pain level controlled Vital Signs Assessment: post-procedure vital signs reviewed and stable Respiratory status: spontaneous breathing, nonlabored ventilation, respiratory function stable and patient connected to nasal cannula oxygen Cardiovascular status: blood pressure returned to baseline and stable Postop Assessment: no signs of nausea or vomiting Anesthetic complications: no     Last Vitals:  Vitals:   10/11/16 1115 10/11/16 1215  BP: 138/79 117/71  Pulse: 82 82  Resp: 12 12  Temp: 36.7 C 37 C    Last Pain:  Vitals:   10/11/16 1215  TempSrc: Oral  PainSc:    Pain Goal: Patients Stated Pain Goal: 3 (10/11/16 [id number])               Iridiana Fonner DAVID

## 2016-10-11 NOTE — Progress Notes (Signed)
No change to H&P.  Traeton Bordas, DO 

## 2016-10-11 NOTE — Anesthesia Procedure Notes (Signed)
Procedure Name: Intubation Performed by: Jonna Munro Pre-anesthesia Checklist: Patient identified, Emergency Drugs available, Suction available, Patient being monitored and Timeout performed Patient Re-evaluated:Patient Re-evaluated prior to inductionOxygen Delivery Method: Circle system utilized Preoxygenation: Pre-oxygenation with 100% oxygen Intubation Type: IV induction Ventilation: Mask ventilation without difficulty Laryngoscope Size: Mac and 3 Grade View: Grade III Tube type: Oral Tube size: 7.0 mm Number of attempts: 1 Airway Equipment and Method: Stylet Secured at: 22 cm Tube secured with: Tape Dental Injury: Teeth and Oropharynx as per pre-operative assessment

## 2016-10-11 NOTE — Op Note (Signed)
[name] PROCEDURE DATE: 10/11/2016  PREOPERATIVE DIAGNOSIS:  Postmenopausal bleeding POSTOPERATIVE DIAGNOSIS:  SAA SURGEON:   Dr. Linda Hedges ASSISTANT:   Dr. Shanon Brow Lower OPERATION:  Total abdominal hysterectomy, Bilateral salpingo-oophorectomy ANESTHESIA:  General endotracheal.  INDICATIONS: The patient is a 60 y.o. with history of recurrent postmenopausal bleeding; declined laparoscopic hysterectomy. The patient made a decision to undergo definite surgical treatment. On the preoperative visit, the risks, benefits, indications, and alternatives of the procedure were reviewed with the patient.  On the day of surgery, the risks of surgery were again discussed with the patient including but not limited to: bleeding which may require transfusion or reoperation; infection which may require antibiotics; injury to bowel, bladder, ureters or other surrounding organs; need for additional procedures; thromboembolic phenomenon, incisional problems and other postoperative/anesthesia complications. Written informed consent was obtained.    OPERATIVE FINDINGS: A 10 week size uterus with normal tubes and ovaries bilaterally.  Minimal scarring of bladder to lower uterine segment.  ESTIMATED BLOOD LOSS: 150 ml SPECIMENS:  Uterus, fallopian tubes and ovaries sent to pathology COMPLICATIONS:  None immediate.   DESCRIPTION OF PROCEDURE: The patient received intravenous antibiotics and had sequential compression devices applied to her lower extremities while in the preoperative area.   She was taken to the operating room and placed under general anesthesia without difficulty and found to be adequate.The abdomen and perineum were prepped and draped in a sterile manner, and a Foley catheter was inserted into the bladder and attached to constant drainage. After an adequate timeout was performed, a Pfannensteil skin incision was made. This incision was taken down to the fascia using electrocautery with care  given to maintain good hemostasis. The fascia was incised in the midline and the fascial incision was then extended bilaterally using electrocautery without difficulty. The fascia was then dissected off the underlying rectus muscles using blunt and sharp dissection. The rectus muscles were split bluntly in the midline and the peritoneum entered sharply without complication. This peritoneal incision was then extended superiorly and inferiorly with care given to prevent bowel or bladder injury. Upon entry into the abdominal cavity, the upper abdomen was inspected and found to be normal. Attention was then turned to the pelvis. An Alexis retractor was placed into the incision, and the bowel was packed away with moist laparotomy sponges. The uterus at this point was noted to be mobilized. The round ligaments on each side were clamped, suture ligated, and transected with electrocautery allowing entry into the broad ligament. The anterior and posterior leaves of the broad ligament were separated and the ureters were inspected to be safely away from the area of dissection bilaterally. A hole was created in the clear portion of the posterior broad ligament and the infundibulopelvic ligament clamped on the patient's right side. This pedicle was then clamped, cut, and doubly suture ligated with good hemostasis.  This procedure was repeated in an identical fashion on the opposite side, incorporating both ovaries and tubes in the pathology specimen, and allowing salpingoophorectomy.  A bladder flap was then created across the anterior leaf of the broad ligament and the bladder was then bluntly dissected off the lower uterine segment and cervix with good hemostasis. The uterine arteries were then skeletonized bilaterally and then clamped, cut, and ligated with care given to prevent ureteral injury. The uterosacral ligaments were then clamped, cut, and ligated bilaterally. The uterus was then amputated across the lower uterine  segment to allow for better visibility. Finally, elevating the cervical stump, the cardinal  ligaments were clamped, cut, and ligated bilaterally.  Acutely curved clamps were placed across the vagina just under the cervix, and the specimen was amputated and sent to pathology. The vaginal cuff was closed with a series of interrupted 0 Vicryl figure-of-eight sutures with care given to incorporate the anterior pubocervical fascia and the posterior rectovaginal fascia.  The vaginal cuff angles were noted to have good hemostasis.  The pelvis was irrigated and hemostasis was reconfirmed at all pedicles and along the pelvic sidewall.  All laparotomy sponges and instruments were removed from the abdomen. The peritoneum was closed with 0 PDS, and the fascia was closed with 0 Vicryl in a running fashion. The skin was closed with a 4-0 Monocryl subcuticular stitch. Sponge, lap, needle, and instrument counts were correct times two. The patient was taken to the recovery area awake, extubated and in stable condition.

## 2016-10-11 NOTE — Addendum Note (Signed)
Addendum  created 10/11/16 1638 by Enis Slipper, CRNA   Sign clinical note

## 2016-10-11 NOTE — Progress Notes (Signed)
Day of Surgery Procedure(s) (LRB): HYSTERECTOMY TOTAL  ABDOMINAL (N/A) BILATERAL SALPINGO OOPHORECTOMY (Bilateral)  Subjective: Patient reports incisional pain improved with PCA (Dilaudid).  No N/V.  No CP/SOB  Objective: I have reviewed patient's vital signs, intake and output and medications.  General: alert, cooperative and appears stated age GI: abnormal findings:  approp ttp and incision: clean, dry and intact Extremities: extremities normal, atraumatic, no cyanosis or edema  Assessment: s/p Procedure(s): HYSTERECTOMY TOTAL  ABDOMINAL (N/A) BILATERAL SALPINGO OOPHORECTOMY (Bilateral): stable  Plan: Advance diet Encourage ambulation  D/C PCA in AM D/C IVF and Foley in AM   LOS: 1 day    Nithila Sumners 10/11/2016, 1:35 PM

## 2016-10-11 NOTE — Anesthesia Preprocedure Evaluation (Signed)
Anesthesia Evaluation  Patient identified by MRN, date of birth, ID band Patient awake    Reviewed: Allergy & Precautions, NPO status , Patient's Chart, lab work & pertinent test results  Airway Mallampati: I  TM Distance: >3 FB Neck ROM: Full    Dental   Pulmonary    Pulmonary exam normal        Cardiovascular hypertension, Pt. on medications Normal cardiovascular exam     Neuro/Psych Depression    GI/Hepatic GERD  Medicated and Controlled,  Endo/Other    Renal/GU      Musculoskeletal   Abdominal   Peds  Hematology   Anesthesia Other Findings   Reproductive/Obstetrics                             Anesthesia Physical Anesthesia Plan  ASA: II  Anesthesia Plan: General   Post-op Pain Management:    Induction: Intravenous  Airway Management Planned: Oral ETT  Additional Equipment:   Intra-op Plan:   Post-operative Plan: Extubation in OR  Informed Consent: I have reviewed the patients History and Physical, chart, labs and discussed the procedure including the risks, benefits and alternatives for the proposed anesthesia with the patient or authorized representative who has indicated his/her understanding and acceptance.     Plan Discussed with: CRNA and Surgeon  Anesthesia Plan Comments:         Anesthesia Quick Evaluation  

## 2016-10-11 NOTE — Transfer of Care (Signed)
Immediate Anesthesia Transfer of Care Note  Patient: [name]  Procedure(s) Performed: Procedure(s): HYSTERECTOMY TOTAL  ABDOMINAL (N/A) BILATERAL SALPINGO OOPHORECTOMY (Bilateral)  Patient Location: PACU  Anesthesia Type:General  Level of Consciousness: awake, alert  and oriented  Airway & Oxygen Therapy: Patient Spontanous Breathing and Patient connected to nasal cannula oxygen  Post-op Assessment: Report given to RN and Post -op Vital signs reviewed and stable  Post vital signs: Reviewed and stable  Last Vitals:  Vitals:   10/11/16 0613  BP: (!) 133/91  Pulse: 72  Resp: 16  Temp: 36.8 C    Last Pain:  Vitals:   10/11/16 0613  TempSrc: Oral      Patients Stated Pain Goal: 3 ([id number])  Complications: No apparent anesthesia complications

## 2016-10-11 NOTE — Anesthesia Postprocedure Evaluation (Signed)
Anesthesia Post Note  Patient: [name]  Procedure(s) Performed: Procedure(s) (LRB): HYSTERECTOMY TOTAL  ABDOMINAL (N/A) BILATERAL SALPINGO OOPHORECTOMY (Bilateral)  Patient location during evaluation: [facility] Anesthesia Type: General Level of consciousness: awake and alert Pain management: pain level controlled Vital Signs Assessment: post-procedure vital signs reviewed and stable Respiratory status: patient connected to nasal cannula oxygen, spontaneous breathing and nonlabored ventilation Cardiovascular status: stable Postop Assessment: no headache, no signs of nausea or vomiting and adequate PO intake Anesthetic complications: no     Last Vitals:  Vitals:   10/11/16 1215 10/11/16 1315  BP: 117/71 115/77  Pulse: 82 69  Resp: 12 11  Temp: 37 C 36.5 C    Last Pain:  Vitals:   10/11/16 1505  TempSrc:   PainSc: 6    Pain Goal: Patients Stated Pain Goal: 3 (10/11/16 [id number])               Francis Dowse J

## 2016-10-12 DIAGNOSIS — I10 Essential (primary) hypertension: Secondary | ICD-10-CM | POA: Diagnosis present

## 2016-10-12 DIAGNOSIS — N950 Postmenopausal bleeding: Secondary | ICD-10-CM | POA: Diagnosis present

## 2016-10-12 DIAGNOSIS — K219 Gastro-esophageal reflux disease without esophagitis: Secondary | ICD-10-CM | POA: Diagnosis present

## 2016-10-12 LAB — CBC
HCT: 34.8 % — ABNORMAL LOW (ref 36.0–46.0)
Hemoglobin: 12 g/dL (ref 12.0–15.0)
MCH: 30.9 pg (ref 26.0–34.0)
MCHC: 34.5 g/dL (ref 30.0–36.0)
MCV: 89.7 fL (ref 78.0–100.0)
Platelets: 234 10*3/uL (ref 150–400)
RBC: 3.88 MIL/uL (ref 3.87–5.11)
RDW: 13.8 % (ref 11.5–15.5)
WBC: 9.8 10*3/uL (ref 4.0–10.5)

## 2016-10-12 LAB — COMPREHENSIVE METABOLIC PANEL
ALT: 29 U/L (ref 14–54)
AST: 25 U/L (ref 15–41)
Albumin: 4.3 g/dL (ref 3.5–5.0)
Alkaline Phosphatase: 84 U/L (ref 38–126)
Anion gap: 8 (ref 5–15)
BUN: 12 mg/dL (ref 6–20)
CO2: 29 mmol/L (ref 22–32)
Calcium: 8.9 mg/dL (ref 8.9–10.3)
Chloride: 102 mmol/L (ref 101–111)
Creatinine, Ser: 0.61 mg/dL (ref 0.44–1.00)
GFR calc Af Amer: >60 mL/min (ref >60)
GFR calc non Af Amer: >60 mL/min (ref >60)
Glucose, Bld: 121 mg/dL — ABNORMAL HIGH (ref 65–99)
Potassium: 3.6 mmol/L (ref 3.5–5.1)
Sodium: 139 mmol/L (ref 135–145)
Total Bilirubin: 0.7 mg/dL (ref 0.3–1.2)
Total Protein: 7 g/dL (ref 6.5–8.1)

## 2016-10-12 MED ORDER — FAMOTIDINE 20 MG PO TABS
20.0000 mg | ORAL_TABLET | Freq: Every day | ORAL | Status: DC
  Administered 2016-10-12: 20 mg via ORAL
  Filled 2016-10-12: qty 1

## 2016-10-12 MED ORDER — PANTOPRAZOLE SODIUM 40 MG PO TBEC
40.0000 mg | DELAYED_RELEASE_TABLET | Freq: Every day | ORAL | Status: DC
  Administered 2016-10-12: 40 mg via ORAL
  Filled 2016-10-12: qty 1

## 2016-10-12 NOTE — Progress Notes (Signed)
Patient passing flatus.  Still having pain; improved with meds but pt more comfortable going home tomorrow. Will d/c tomorrow.  Linda Hedges, DO

## 2016-10-12 NOTE — Progress Notes (Signed)
Spoke with Dr. Julien Girt regarding patients nighttime medication not ordered (Norvasc & pepcid). Order to continue Pepcid. Do not order Norvasc at this time, continue to monitor BP if elevates during the night call for further instructions.

## 2016-10-12 NOTE — Progress Notes (Signed)
Patient feeling well.  Tolerating po without n/v.  Ambulating well.  Pain well controlled with po meds (d/c'd PCA).  Foley out but no void yet.  No flatus yet.    VSS. AF. UOP adequate. Labs reviewed and appropriate  Gen: A&O x 3 Abd: soft, ND, dressing c/d Ext: no c/c/e  POD#1 s/p TAH, BSO -Continue pain control -Ambulate -Possible d/c this pm; will check on pt mid-day  Linda Hedges, DO

## 2016-10-13 ENCOUNTER — Encounter (HOSPITAL_COMMUNITY): Payer: Self-pay | Admitting: Obstetrics & Gynecology

## 2016-10-13 MED ORDER — OXYCODONE-ACETAMINOPHEN 5-325 MG PO TABS: 1.0000 | ORAL_TABLET | ORAL | 0 refills | Status: DC | PRN

## 2016-10-13 MED ORDER — IBUPROFEN 800 MG PO TABS: 800.0000 mg | ORAL_TABLET | Freq: Four times a day (QID) | ORAL | 0 refills | Status: DC | PRN

## 2016-10-13 NOTE — Progress Notes (Signed)
Discharge teaching complete. Pt understood all instructions and did not have any questions. Pt ambulated out of the hospital and discharged home to family.  

## 2016-10-13 NOTE — Progress Notes (Signed)
No current c/o.  Doing well and meeting all goals.  VSS. AF. Gen: A&O x 2 Abd: soft, ND, inc c/d Ext: no c/c/e  POD#2 s/p TAH, BSO -D/C home.

## 2016-10-13 NOTE — Discharge Instructions (Signed)
Call MD for T>100.4, heavy vaginal bleeding, severe abdominal pain, intractable nausea and/or vomiting, or respiratory distress.  Call office to schedule postop appointment in 2 weeks.  No driving while taking narcotics.  Pelvic rest x 6 weeks. °

## 2016-10-13 NOTE — Discharge Summary (Signed)
Physician Discharge Summary  Patient ID: Whitney [name] MRN: [id number] DOB/AGE: 59-27-1957 59 y.o.  Admit date: 10/11/2016 Discharge date: 10/13/2016  Admission Diagnoses:  Postmenopausal bleeding  Discharge Diagnoses:   SAA Active Problems:   S/P abdominal hysterectomy   Discharged Condition: good  Hospital Course: Patient was admitted for definitive tx of postmenopausal bleeding.  Patient underwent the anticipated procedure of TAH, BSO without complications.  She had an uncomplicated postop course and was discharged home on postop day 2 with office f/u in 2 weeks.  Consults: None  Significant Diagnostic Studies: none  Treatments: surgery: TAH, BSO  Discharge Exam: Blood pressure (!) 141/78, pulse 62, temperature 98.3 F (36.8 C), temperature source Oral, resp. rate 18, height 5\' 6"  (1.676 m), weight 180 lb (81.6 kg), SpO2 95 %. General appearance: alert, cooperative and appears stated age GI: approp ttp, no distention Extremities: extremities normal, atraumatic, no cyanosis or edema Incision/Wound: dressing c/d  Disposition: 01-Home or Self Care     Medication List    TAKE these medications   amLODipine 5 MG tablet Commonly known as:  NORVASC Take 5 mg by mouth at bedtime.   citalopram 20 MG tablet Commonly known as:  CELEXA Take 10 mg by mouth at bedtime. Take 1/2 tablet daily at bedtime.   famotidine 20 MG tablet Commonly known as:  PEPCID TAKE 1 TABLET BY MOUTH ONCE DAILY AT BEDTIME   ibuprofen 800 MG tablet Commonly known as:  ADVIL Take 1 tablet (800 mg total) by mouth every 6 (six) hours as needed.   metoprolol succinate 100 MG 24 hr tablet Commonly known as:  TOPROL-XL One half in am and one half pm What changed:  how much to take  how to take this  when to take this  additional instructions   oxyCODONE-acetaminophen 5-325 MG tablet Commonly known as:  PERCOCET/ROXICET Take 1-2 tablets by mouth every 4 (four) hours as needed  (moderate to severe pain (when tolerating fluids)).   pantoprazole 40 MG tablet Commonly known as:  PROTONIX Take 1 tablet (40 mg total) by mouth daily. Take 30-60 min before first meal of the day        Signed: Nazeer Romney 10/13/2016, 7:55 AM

## 2016-10-20 DIAGNOSIS — E782 Mixed hyperlipidemia: Secondary | ICD-10-CM | POA: Diagnosis not present

## 2016-10-20 DIAGNOSIS — I10 Essential (primary) hypertension: Secondary | ICD-10-CM | POA: Diagnosis not present

## 2017-03-07 DIAGNOSIS — D2272 Melanocytic nevi of left lower limb, including hip: Secondary | ICD-10-CM | POA: Diagnosis not present

## 2017-03-07 DIAGNOSIS — D225 Melanocytic nevi of trunk: Secondary | ICD-10-CM | POA: Diagnosis not present

## 2017-03-07 DIAGNOSIS — L720 Epidermal cyst: Secondary | ICD-10-CM | POA: Diagnosis not present

## 2017-03-07 DIAGNOSIS — L821 Other seborrheic keratosis: Secondary | ICD-10-CM | POA: Diagnosis not present

## 2017-03-07 DIAGNOSIS — Z85828 Personal history of other malignant neoplasm of skin: Secondary | ICD-10-CM | POA: Diagnosis not present

## 2017-04-20 DIAGNOSIS — F411 Generalized anxiety disorder: Secondary | ICD-10-CM | POA: Diagnosis not present

## 2017-04-20 DIAGNOSIS — K219 Gastro-esophageal reflux disease without esophagitis: Secondary | ICD-10-CM | POA: Diagnosis not present

## 2017-04-20 DIAGNOSIS — Z6828 Body mass index (BMI) 28.0-28.9, adult: Secondary | ICD-10-CM | POA: Diagnosis not present

## 2017-04-20 DIAGNOSIS — Z5181 Encounter for therapeutic drug level monitoring: Secondary | ICD-10-CM | POA: Diagnosis not present

## 2017-04-20 DIAGNOSIS — G4701 Insomnia due to medical condition: Secondary | ICD-10-CM | POA: Diagnosis not present

## 2017-04-20 DIAGNOSIS — E663 Overweight: Secondary | ICD-10-CM | POA: Diagnosis not present

## 2017-04-20 DIAGNOSIS — I10 Essential (primary) hypertension: Secondary | ICD-10-CM | POA: Diagnosis not present

## 2017-04-20 DIAGNOSIS — H04123 Dry eye syndrome of bilateral lacrimal glands: Secondary | ICD-10-CM | POA: Diagnosis not present

## 2017-04-20 DIAGNOSIS — E782 Mixed hyperlipidemia: Secondary | ICD-10-CM | POA: Diagnosis not present

## 2017-07-11 DIAGNOSIS — N951 Menopausal and female climacteric states: Secondary | ICD-10-CM | POA: Diagnosis not present

## 2017-07-13 DIAGNOSIS — M2550 Pain in unspecified joint: Secondary | ICD-10-CM | POA: Diagnosis not present

## 2017-07-13 DIAGNOSIS — R6882 Decreased libido: Secondary | ICD-10-CM | POA: Diagnosis not present

## 2017-07-13 DIAGNOSIS — N898 Other specified noninflammatory disorders of vagina: Secondary | ICD-10-CM | POA: Diagnosis not present

## 2017-07-13 DIAGNOSIS — R5383 Other fatigue: Secondary | ICD-10-CM | POA: Diagnosis not present

## 2017-07-13 DIAGNOSIS — G479 Sleep disorder, unspecified: Secondary | ICD-10-CM | POA: Diagnosis not present

## 2017-07-13 DIAGNOSIS — N958 Other specified menopausal and perimenopausal disorders: Secondary | ICD-10-CM | POA: Diagnosis not present

## 2017-08-08 DIAGNOSIS — N958 Other specified menopausal and perimenopausal disorders: Secondary | ICD-10-CM | POA: Diagnosis not present

## 2017-08-08 DIAGNOSIS — N898 Other specified noninflammatory disorders of vagina: Secondary | ICD-10-CM | POA: Diagnosis not present

## 2017-08-08 DIAGNOSIS — R6882 Decreased libido: Secondary | ICD-10-CM | POA: Diagnosis not present

## 2017-08-08 DIAGNOSIS — G479 Sleep disorder, unspecified: Secondary | ICD-10-CM | POA: Diagnosis not present

## 2017-08-10 DIAGNOSIS — R5383 Other fatigue: Secondary | ICD-10-CM | POA: Diagnosis not present

## 2017-08-10 DIAGNOSIS — R6882 Decreased libido: Secondary | ICD-10-CM | POA: Diagnosis not present

## 2017-08-10 DIAGNOSIS — N898 Other specified noninflammatory disorders of vagina: Secondary | ICD-10-CM | POA: Diagnosis not present

## 2017-10-17 DIAGNOSIS — N898 Other specified noninflammatory disorders of vagina: Secondary | ICD-10-CM | POA: Diagnosis not present

## 2017-10-17 DIAGNOSIS — R5383 Other fatigue: Secondary | ICD-10-CM | POA: Diagnosis not present

## 2017-10-17 DIAGNOSIS — R6882 Decreased libido: Secondary | ICD-10-CM | POA: Diagnosis not present

## 2017-10-19 DIAGNOSIS — R5383 Other fatigue: Secondary | ICD-10-CM | POA: Diagnosis not present

## 2017-10-19 DIAGNOSIS — G479 Sleep disorder, unspecified: Secondary | ICD-10-CM | POA: Diagnosis not present

## 2017-10-19 DIAGNOSIS — N898 Other specified noninflammatory disorders of vagina: Secondary | ICD-10-CM | POA: Diagnosis not present

## 2017-10-19 DIAGNOSIS — R6882 Decreased libido: Secondary | ICD-10-CM | POA: Diagnosis not present

## 2017-10-27 DIAGNOSIS — Z79899 Other long term (current) drug therapy: Secondary | ICD-10-CM | POA: Diagnosis not present

## 2017-10-27 DIAGNOSIS — G4701 Insomnia due to medical condition: Secondary | ICD-10-CM | POA: Diagnosis not present

## 2017-10-27 DIAGNOSIS — E663 Overweight: Secondary | ICD-10-CM | POA: Diagnosis not present

## 2017-10-27 DIAGNOSIS — F411 Generalized anxiety disorder: Secondary | ICD-10-CM | POA: Diagnosis not present

## 2017-10-27 DIAGNOSIS — I10 Essential (primary) hypertension: Secondary | ICD-10-CM | POA: Diagnosis not present

## 2017-10-27 DIAGNOSIS — Z23 Encounter for immunization: Secondary | ICD-10-CM | POA: Diagnosis not present

## 2017-10-27 DIAGNOSIS — K219 Gastro-esophageal reflux disease without esophagitis: Secondary | ICD-10-CM | POA: Diagnosis not present

## 2017-10-27 DIAGNOSIS — E782 Mixed hyperlipidemia: Secondary | ICD-10-CM | POA: Diagnosis not present

## 2018-03-06 DIAGNOSIS — H6690 Otitis media, unspecified, unspecified ear: Secondary | ICD-10-CM | POA: Diagnosis not present

## 2018-03-06 DIAGNOSIS — J209 Acute bronchitis, unspecified: Secondary | ICD-10-CM | POA: Diagnosis not present

## 2018-04-17 DIAGNOSIS — Z79899 Other long term (current) drug therapy: Secondary | ICD-10-CM | POA: Diagnosis not present

## 2018-04-17 DIAGNOSIS — G4701 Insomnia due to medical condition: Secondary | ICD-10-CM | POA: Diagnosis not present

## 2018-04-17 DIAGNOSIS — R7301 Impaired fasting glucose: Secondary | ICD-10-CM | POA: Diagnosis not present

## 2018-04-17 DIAGNOSIS — I10 Essential (primary) hypertension: Secondary | ICD-10-CM | POA: Diagnosis not present

## 2018-04-17 DIAGNOSIS — H04123 Dry eye syndrome of bilateral lacrimal glands: Secondary | ICD-10-CM | POA: Diagnosis not present

## 2018-04-17 DIAGNOSIS — E663 Overweight: Secondary | ICD-10-CM | POA: Diagnosis not present

## 2018-04-17 DIAGNOSIS — E782 Mixed hyperlipidemia: Secondary | ICD-10-CM | POA: Diagnosis not present

## 2018-04-17 DIAGNOSIS — F411 Generalized anxiety disorder: Secondary | ICD-10-CM | POA: Diagnosis not present

## 2018-04-17 DIAGNOSIS — K219 Gastro-esophageal reflux disease without esophagitis: Secondary | ICD-10-CM | POA: Diagnosis not present

## 2018-04-24 DIAGNOSIS — I788 Other diseases of capillaries: Secondary | ICD-10-CM | POA: Diagnosis not present

## 2018-04-24 DIAGNOSIS — L905 Scar conditions and fibrosis of skin: Secondary | ICD-10-CM | POA: Diagnosis not present

## 2018-04-24 DIAGNOSIS — L578 Other skin changes due to chronic exposure to nonionizing radiation: Secondary | ICD-10-CM | POA: Diagnosis not present

## 2018-04-24 DIAGNOSIS — Z85828 Personal history of other malignant neoplasm of skin: Secondary | ICD-10-CM | POA: Diagnosis not present

## 2018-04-24 DIAGNOSIS — L821 Other seborrheic keratosis: Secondary | ICD-10-CM | POA: Diagnosis not present

## 2018-04-24 DIAGNOSIS — L281 Prurigo nodularis: Secondary | ICD-10-CM | POA: Diagnosis not present

## 2018-04-24 DIAGNOSIS — L814 Other melanin hyperpigmentation: Secondary | ICD-10-CM | POA: Diagnosis not present

## 2018-04-24 DIAGNOSIS — D225 Melanocytic nevi of trunk: Secondary | ICD-10-CM | POA: Diagnosis not present

## 2018-04-24 DIAGNOSIS — L738 Other specified follicular disorders: Secondary | ICD-10-CM | POA: Diagnosis not present

## 2018-06-22 IMAGING — DX DG CHEST 2V
2 series · 2 of 2 positions shown · non-contrast
Comparison: April 15, 2014 chest CT

CLINICAL DATA: Fever

EXAM:
CHEST  2 VIEW

[chest pa]
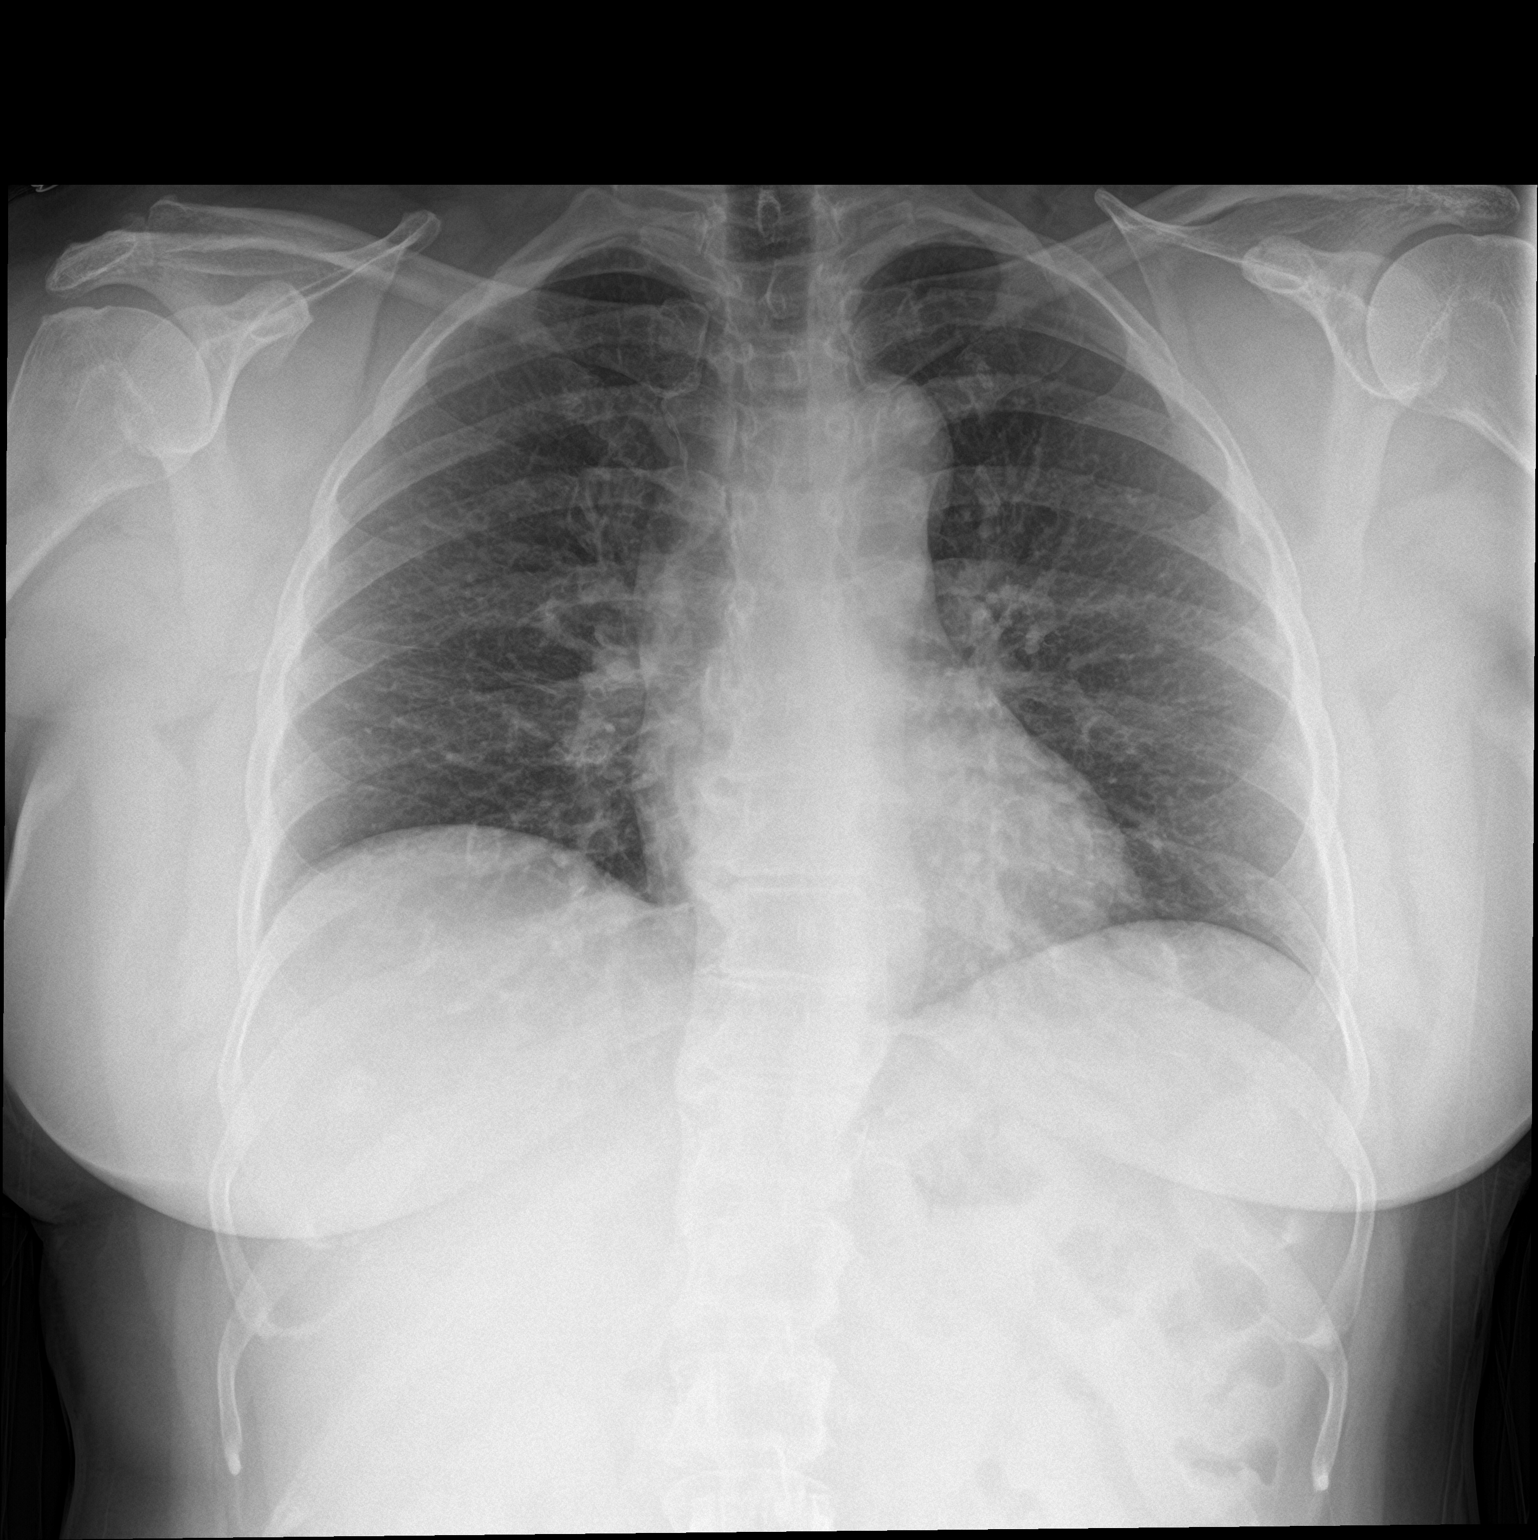

[chest lat]
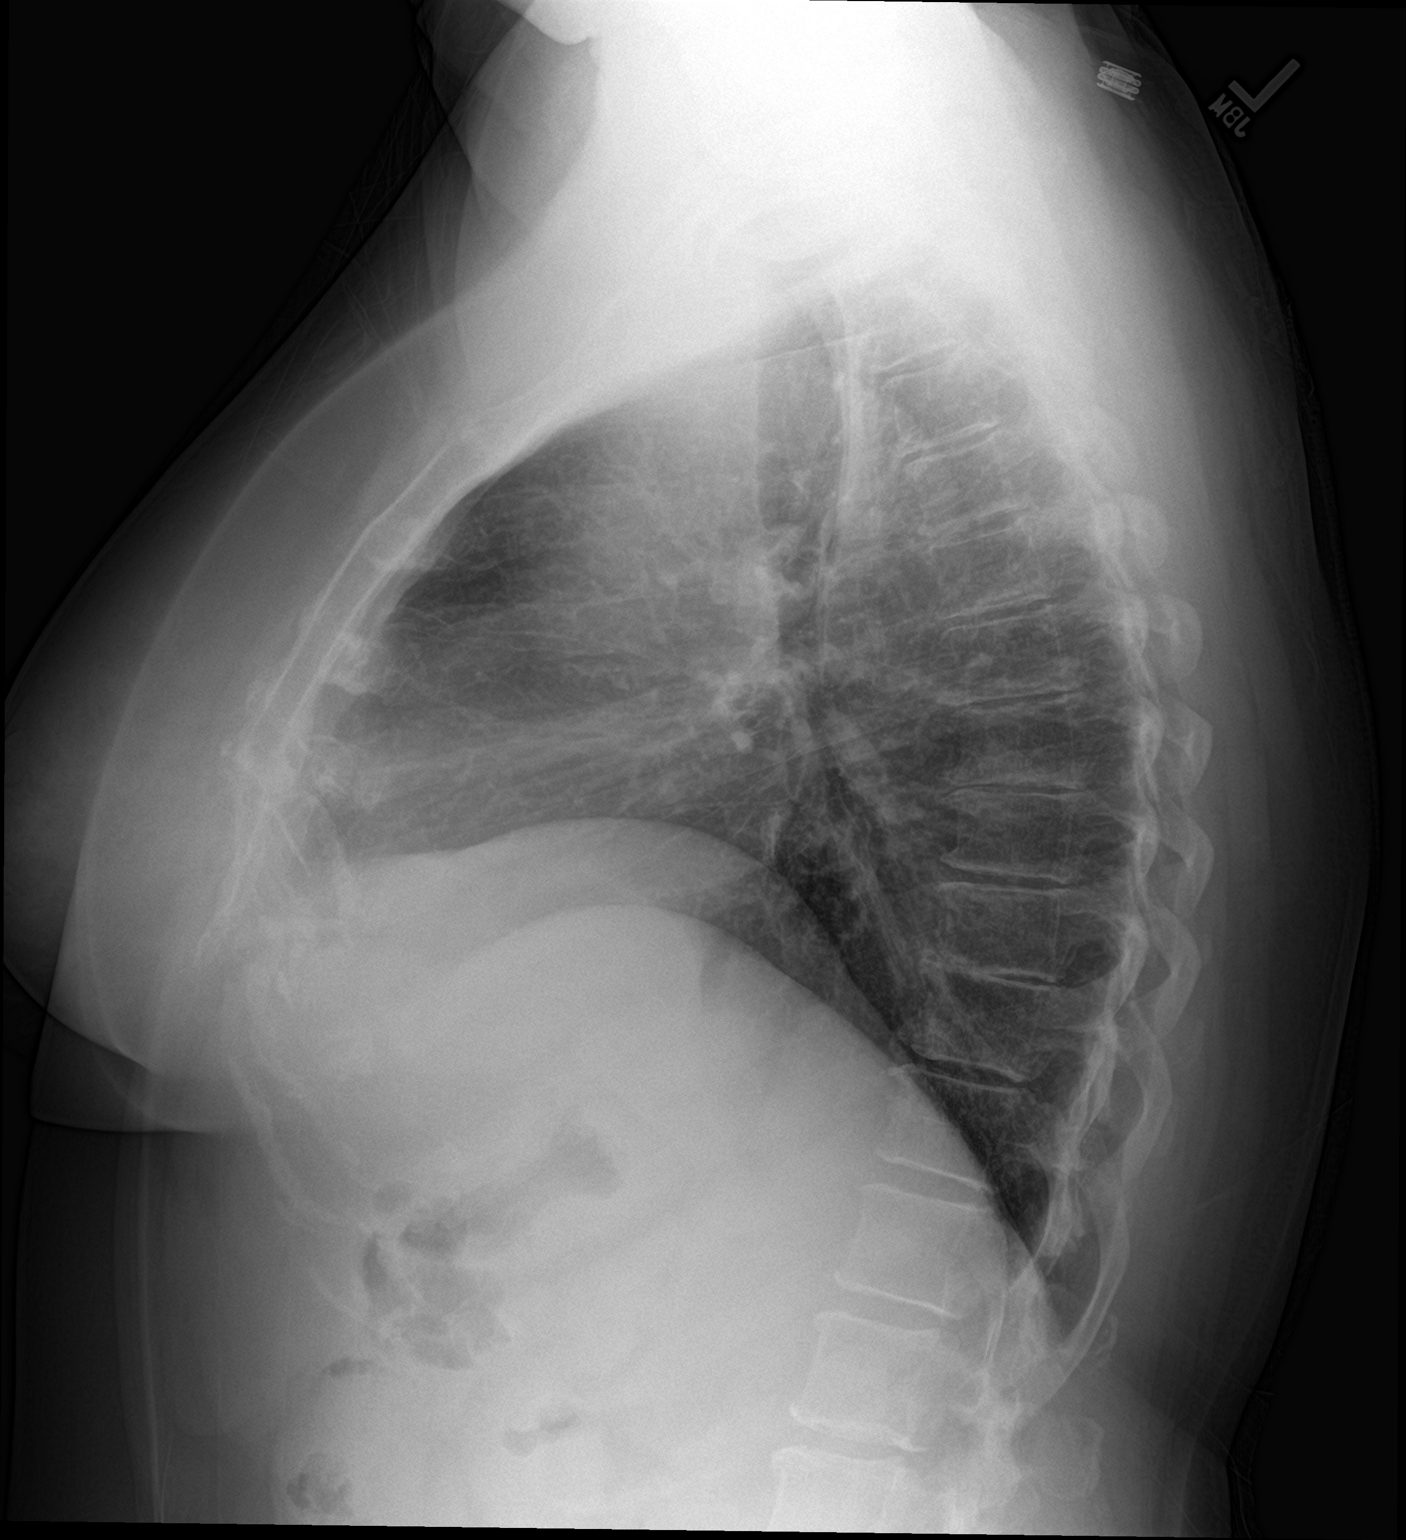

[2 of 2 positions shown; findings below may reference images not displayed]

FINDINGS: There is no edema or consolidation. The pulmonary nodular lesions
seen on the prior CT study are not appreciable by radiography. Heart
size and pulmonary vascularity are normal. No adenopathy. There is
degenerative change in each shoulder and in the thoracic spine.
IMPRESSION: No edema or consolidation.

## 2018-10-18 DIAGNOSIS — E663 Overweight: Secondary | ICD-10-CM | POA: Diagnosis not present

## 2018-10-18 DIAGNOSIS — E782 Mixed hyperlipidemia: Secondary | ICD-10-CM | POA: Diagnosis not present

## 2018-10-18 DIAGNOSIS — Z6829 Body mass index (BMI) 29.0-29.9, adult: Secondary | ICD-10-CM | POA: Diagnosis not present

## 2018-10-18 DIAGNOSIS — I10 Essential (primary) hypertension: Secondary | ICD-10-CM | POA: Diagnosis not present

## 2018-10-18 DIAGNOSIS — R7301 Impaired fasting glucose: Secondary | ICD-10-CM | POA: Diagnosis not present

## 2018-10-18 DIAGNOSIS — G4701 Insomnia due to medical condition: Secondary | ICD-10-CM | POA: Diagnosis not present

## 2018-10-18 DIAGNOSIS — F411 Generalized anxiety disorder: Secondary | ICD-10-CM | POA: Diagnosis not present

## 2018-10-18 DIAGNOSIS — K219 Gastro-esophageal reflux disease without esophagitis: Secondary | ICD-10-CM | POA: Diagnosis not present

## 2018-11-29 DIAGNOSIS — I10 Essential (primary) hypertension: Secondary | ICD-10-CM | POA: Diagnosis not present

## 2018-11-29 DIAGNOSIS — R0600 Dyspnea, unspecified: Secondary | ICD-10-CM | POA: Diagnosis not present

## 2018-11-29 DIAGNOSIS — R600 Localized edema: Secondary | ICD-10-CM | POA: Diagnosis not present

## 2018-11-29 DIAGNOSIS — R635 Abnormal weight gain: Secondary | ICD-10-CM | POA: Diagnosis not present

## 2018-12-07 DIAGNOSIS — Z79899 Other long term (current) drug therapy: Secondary | ICD-10-CM | POA: Diagnosis not present

## 2018-12-07 DIAGNOSIS — R600 Localized edema: Secondary | ICD-10-CM | POA: Diagnosis not present

## 2018-12-07 DIAGNOSIS — I10 Essential (primary) hypertension: Secondary | ICD-10-CM | POA: Diagnosis not present

## 2018-12-07 DIAGNOSIS — K529 Noninfective gastroenteritis and colitis, unspecified: Secondary | ICD-10-CM | POA: Diagnosis not present

## 2019-03-12 DIAGNOSIS — M25561 Pain in right knee: Secondary | ICD-10-CM | POA: Diagnosis not present

## 2019-03-15 DIAGNOSIS — M25561 Pain in right knee: Secondary | ICD-10-CM | POA: Diagnosis not present

## 2019-03-19 DIAGNOSIS — M25561 Pain in right knee: Secondary | ICD-10-CM | POA: Diagnosis not present

## 2019-03-27 ENCOUNTER — Ambulatory Visit: Payer: Self-pay | Admitting: Physician Assistant

## 2019-03-27 NOTE — H&P (View-Only) (Signed)
Whitney [name] is an 63 y.o. female.   Chief Complaint: right knee pain HPI: [name] has a well defined meniscal tear with cartilage loss medial and patellofemoral. She does have a significant amount of pain.    Past Medical History:  Diagnosis Date  . Arthritis    hands  . Basal cell carcinoma of face   . Depression   . GAD (generalized anxiety disorder)   . GERD (gastroesophageal reflux disease)   . Hypertension   . Insomnia   . Pneumonia 07/27/2016   Bacterial    Past Surgical History:  Procedure Laterality Date  . ABDOMINAL HYSTERECTOMY N/A 10/11/2016   Procedure: HYSTERECTOMY TOTAL  ABDOMINAL;  Surgeon: Linda Hedges, DO;  Location: [facility];  Service: Gynecology;  Laterality: N/A;  . CESAREAN SECTION     x2  . COLONOSCOPY    . DILATION AND CURETTAGE OF UTERUS    . SALPINGOOPHORECTOMY Bilateral 10/11/2016   Procedure: BILATERAL SALPINGO OOPHORECTOMY;  Surgeon: Linda Hedges, DO;  Location: [facility];  Service: Gynecology;  Laterality: Bilateral;  . TENDON TRANSFER  08/30/2012   Procedure: TENDON TRANSFER;  Surgeon: Roseanne Kaufman, MD;  Location: [facility];  Service: Orthopedics;  Laterality: Left;  LEFT THUMB EXTENSOR INDICUS POLLICUS TO EXTENSOR POLLICUS LONGUS TENDON TRANSFER GENERAL WITH BLOCK  . WISDOM TOOTH EXTRACTION      Family History  Problem Relation Age of Onset  . Lung cancer Father        smoker   Social History:  reports that she has never smoked. She has never used smokeless tobacco. She reports current alcohol use. She reports that she does not use drugs.  Allergies:  Allergies  Allergen Reactions  . Latex Rash    (Not in a hospital admission)   No results found for this or any previous visit (from the past 48 hour(s)). No results found.  Review of Systems  Musculoskeletal: Positive for joint pain.  All other systems reviewed and are negative.   There were no vitals taken for this visit. Physical Exam  Constitutional: She is  oriented to person, place, and time. She appears well-developed and well-nourished. No distress.  HENT:  Head: Normocephalic and atraumatic.  Eyes: Pupils are equal, round, and reactive to light. Conjunctivae are normal.  Neck: Normal range of motion. Neck supple.  Cardiovascular: Normal rate and intact distal pulses.  Respiratory: Effort normal. No respiratory distress.  GI: Soft. She exhibits no distension.  Musculoskeletal:     Right knee: She exhibits swelling and bony tenderness. Tenderness found. Medial joint line tenderness noted.  Neurological: She is alert and oriented to person, place, and time.  Skin: Skin is warm and dry. No rash noted. No erythema.  Psychiatric: She has a normal mood and affect. Her behavior is normal.     Assessment/Plan The risks and benefits of arthroscopic partial medial meniscectomy, chondroplasty and possible microfracture described to the patient. She works at [employer]. We can get her done at the [facility] with choice of anesthesia. I told her while there is no indication I do sometimes think  we could do prophylactic aspirin but it is not a uniform recommendation. We will proceed on with scheduling. She will need post operative physical therapy afterwards as well.   Chriss Czar, PA-C 03/27/2019, 4:30 PM

## 2019-03-27 NOTE — H&P (Signed)
Whitney [name] is an 63 y.o. female.   Chief Complaint: right knee pain HPI: Whitney [name] has a well defined meniscal tear with cartilage loss medial and patellofemoral. She does have a significant amount of pain.    Past Medical History:  Diagnosis Date  . Arthritis    hands  . Basal cell carcinoma of face   . Depression   . GAD (generalized anxiety disorder)   . GERD (gastroesophageal reflux disease)   . Hypertension   . Insomnia   . Pneumonia 07/27/2016   Bacterial    Past Surgical History:  Procedure Laterality Date  . ABDOMINAL HYSTERECTOMY N/A 10/11/2016   Procedure: HYSTERECTOMY TOTAL  ABDOMINAL;  Surgeon: Whitney Hedges, DO;  Location: [name];  Service: Gynecology;  Laterality: N/A;  . CESAREAN SECTION     x2  . COLONOSCOPY    . DILATION AND CURETTAGE OF UTERUS    . SALPINGOOPHORECTOMY Bilateral 10/11/2016   Procedure: BILATERAL SALPINGO OOPHORECTOMY;  Surgeon: Whitney Hedges, DO;  Location: [name];  Service: Gynecology;  Laterality: Bilateral;  . TENDON TRANSFER  08/30/2012   Procedure: TENDON TRANSFER;  Surgeon: Whitney Kaufman, MD;  Location: [name];  Service: Orthopedics;  Laterality: Left;  LEFT THUMB EXTENSOR INDICUS POLLICUS TO EXTENSOR POLLICUS LONGUS TENDON TRANSFER GENERAL WITH BLOCK  . WISDOM TOOTH EXTRACTION      Family History  Problem Relation Age of Onset  . Lung cancer Father        smoker   Social History:  reports that she has never smoked. She has never used smokeless tobacco. She reports current alcohol use. She reports that she does not use drugs.  Allergies:  Allergies  Allergen Reactions  . Latex Rash    (Not in a hospital admission)   No results found for this or any previous visit (from the past 48 hour(s)). No results found.  Review of Systems  Musculoskeletal: Positive for joint pain.  All other systems reviewed and are negative.   There were no vitals taken for this visit. Physical Exam  Constitutional: She is  oriented to person, place, and time. She appears well-developed and well-nourished. No distress.  HENT:  Head: Normocephalic and atraumatic.  Eyes: Pupils are equal, round, and reactive to light. Conjunctivae are normal.  Neck: Normal range of motion. Neck supple.  Cardiovascular: Normal rate and intact distal pulses.  Respiratory: Effort normal. No respiratory distress.  GI: Soft. She exhibits no distension.  Musculoskeletal:     Right knee: She exhibits swelling and bony tenderness. Tenderness found. Medial joint line tenderness noted.  Neurological: She is alert and oriented to person, place, and time.  Skin: Skin is warm and dry. No rash noted. No erythema.  Psychiatric: She has a normal mood and affect. Her behavior is normal.     Assessment/Plan The risks and benefits of arthroscopic partial medial meniscectomy, chondroplasty and possible microfracture described to the patient. She works at [name]. We can get her done at the [name] with choice of anesthesia. I told her while there is no indication I do sometimes think  we could do prophylactic aspirin but it is not a uniform recommendation. We will proceed on with scheduling. She will need post operative physical therapy afterwards as well.   Whitney Czar, PA-C 03/27/2019, 4:30 PM

## 2019-04-15 ENCOUNTER — Encounter (HOSPITAL_BASED_OUTPATIENT_CLINIC_OR_DEPARTMENT_OTHER): Payer: Self-pay | Admitting: *Deleted

## 2019-04-15 ENCOUNTER — Other Ambulatory Visit: Payer: Self-pay

## 2019-04-16 ENCOUNTER — Encounter (HOSPITAL_BASED_OUTPATIENT_CLINIC_OR_DEPARTMENT_OTHER)
Admission: RE | Admit: 2019-04-16 | Discharge: 2019-04-16 | Disposition: A | Discharge status: Home / Self Care | Payer: 59 | Source: Ambulatory Visit | Attending: Orthopedic Surgery | Admitting: Orthopedic Surgery

## 2019-04-16 ENCOUNTER — Other Ambulatory Visit (HOSPITAL_COMMUNITY)
Admission: RE | Admit: 2019-04-16 | Discharge: 2019-04-16 | Disposition: A | Discharge status: Home / Self Care | Payer: 59 | Source: Ambulatory Visit | Attending: Orthopedic Surgery | Admitting: Orthopedic Surgery

## 2019-04-16 DIAGNOSIS — F329 Major depressive disorder, single episode, unspecified: Secondary | ICD-10-CM | POA: Diagnosis not present

## 2019-04-16 DIAGNOSIS — M19041 Primary osteoarthritis, right hand: Secondary | ICD-10-CM | POA: Diagnosis not present

## 2019-04-16 DIAGNOSIS — X58XXXA Exposure to other specified factors, initial encounter: Secondary | ICD-10-CM | POA: Diagnosis not present

## 2019-04-16 DIAGNOSIS — Z90710 Acquired absence of both cervix and uterus: Secondary | ICD-10-CM | POA: Diagnosis not present

## 2019-04-16 DIAGNOSIS — K219 Gastro-esophageal reflux disease without esophagitis: Secondary | ICD-10-CM | POA: Diagnosis not present

## 2019-04-16 DIAGNOSIS — Z91040 Latex allergy status: Secondary | ICD-10-CM | POA: Diagnosis not present

## 2019-04-16 DIAGNOSIS — Z801 Family history of malignant neoplasm of trachea, bronchus and lung: Secondary | ICD-10-CM | POA: Diagnosis not present

## 2019-04-16 DIAGNOSIS — M25561 Pain in right knee: Secondary | ICD-10-CM | POA: Diagnosis present

## 2019-04-16 DIAGNOSIS — F411 Generalized anxiety disorder: Secondary | ICD-10-CM | POA: Diagnosis not present

## 2019-04-16 DIAGNOSIS — M19042 Primary osteoarthritis, left hand: Secondary | ICD-10-CM | POA: Diagnosis not present

## 2019-04-16 DIAGNOSIS — G4700 Insomnia, unspecified: Secondary | ICD-10-CM | POA: Diagnosis not present

## 2019-04-16 DIAGNOSIS — Z1159 Encounter for screening for other viral diseases: Secondary | ICD-10-CM | POA: Insufficient documentation

## 2019-04-16 DIAGNOSIS — M94261 Chondromalacia, right knee: Secondary | ICD-10-CM | POA: Diagnosis not present

## 2019-04-16 DIAGNOSIS — I10 Essential (primary) hypertension: Secondary | ICD-10-CM | POA: Diagnosis not present

## 2019-04-16 DIAGNOSIS — S83231A Complex tear of medial meniscus, current injury, right knee, initial encounter: Secondary | ICD-10-CM | POA: Diagnosis not present

## 2019-04-16 DIAGNOSIS — Z85828 Personal history of other malignant neoplasm of skin: Secondary | ICD-10-CM | POA: Diagnosis not present

## 2019-04-16 LAB — BASIC METABOLIC PANEL
Anion gap: 12 (ref 5–15)
BUN: 9 mg/dL (ref 8–23)
CO2: 33 mmol/L — ABNORMAL HIGH (ref 22–32)
Calcium: 9.2 mg/dL (ref 8.9–10.3)
Chloride: 97 mmol/L — ABNORMAL LOW (ref 98–111)
Creatinine, Ser: 0.72 mg/dL (ref 0.44–1.00)
GFR calc Af Amer: >60 mL/min (ref >60)
GFR calc non Af Amer: >60 mL/min (ref >60)
Glucose, Bld: 96 mg/dL (ref 70–99)
Potassium: 2.9 mmol/L — ABNORMAL LOW (ref 3.5–5.1)
Sodium: 142 mmol/L (ref 135–145)

## 2019-04-16 NOTE — Progress Notes (Signed)
EKG shown to Dr Valma Cava. Will Procedd with surgery. Pt asked for copies of current and previous EKG. Copies made and given.  Pt given instructions on CHG bath. Pt verbalized understanding.

## 2019-04-17 DIAGNOSIS — E876 Hypokalemia: Secondary | ICD-10-CM | POA: Diagnosis not present

## 2019-04-17 LAB — NOVEL CORONAVIRUS, NAA (HOSP ORDER, SEND-OUT TO REF LAB; TAT 18-24 HRS): SARS-CoV-2, NAA: NOT DETECTED

## 2019-04-17 NOTE — Progress Notes (Signed)
Dr. Conrad New Waverly ( Anesthesia) notified of K+2.9, notified Claiborne Billings at Dr. Alvester Morin office, will recheck K+ day of surgery.

## 2019-04-18 NOTE — Anesthesia Preprocedure Evaluation (Signed)
Anesthesia Evaluation  Patient identified by MRN, date of birth, ID band Patient awake    Reviewed: Allergy & Precautions, NPO status , Patient's Chart, lab work & pertinent test results  Airway Mallampati: I  TM Distance: >3 FB Neck ROM: Full    Dental   Pulmonary    Pulmonary exam normal        Cardiovascular hypertension, Pt. on medications and Pt. on home beta blockers Normal cardiovascular exam     Neuro/Psych PSYCHIATRIC DISORDERS Anxiety Depression negative neurological ROS     GI/Hepatic Neg liver ROS, GERD  Medicated and Controlled,  Endo/Other  negative endocrine ROS  Renal/GU negative Renal ROS  negative genitourinary   Musculoskeletal  (+) Arthritis , Osteoarthritis,    Abdominal   Peds  Hematology negative hematology ROS (+)   Anesthesia Other Findings   Reproductive/Obstetrics negative OB ROS                             Anesthesia Physical  Anesthesia Plan  ASA: II  Anesthesia Plan: General   Post-op Pain Management:    Induction: Intravenous  PONV Risk Score and Plan: 2 and Ondansetron, Dexamethasone and Treatment may vary due to age or medical condition  Airway Management Planned: Oral ETT and LMA  Additional Equipment:   Intra-op Plan:   Post-operative Plan: Extubation in OR  Informed Consent: I have reviewed the patients History and Physical, chart, labs and discussed the procedure including the risks, benefits and alternatives for the proposed anesthesia with the patient or authorized representative who has indicated his/her understanding and acceptance.       Plan Discussed with: CRNA, Surgeon and Anesthesiologist  Anesthesia Plan Comments:         Anesthesia Quick Evaluation

## 2019-04-19 ENCOUNTER — Ambulatory Visit (HOSPITAL_BASED_OUTPATIENT_CLINIC_OR_DEPARTMENT_OTHER)
Admission: RE | Admit: 2019-04-19 | Discharge: 2019-04-19 | Disposition: A | Discharge status: Home / Self Care | Payer: 59 | Attending: Orthopedic Surgery | Admitting: Orthopedic Surgery

## 2019-04-19 ENCOUNTER — Other Ambulatory Visit: Payer: Self-pay

## 2019-04-19 ENCOUNTER — Ambulatory Visit (HOSPITAL_BASED_OUTPATIENT_CLINIC_OR_DEPARTMENT_OTHER): Payer: 59 | Admitting: Anesthesiology

## 2019-04-19 ENCOUNTER — Encounter (HOSPITAL_BASED_OUTPATIENT_CLINIC_OR_DEPARTMENT_OTHER)
Admission: RE | Disposition: A | Discharge status: Home / Self Care | Payer: Self-pay | Source: Home / Self Care | Attending: Orthopedic Surgery

## 2019-04-19 ENCOUNTER — Encounter (HOSPITAL_BASED_OUTPATIENT_CLINIC_OR_DEPARTMENT_OTHER): Payer: Self-pay

## 2019-04-19 DIAGNOSIS — X58XXXA Exposure to other specified factors, initial encounter: Secondary | ICD-10-CM | POA: Insufficient documentation

## 2019-04-19 DIAGNOSIS — M19041 Primary osteoarthritis, right hand: Secondary | ICD-10-CM | POA: Insufficient documentation

## 2019-04-19 DIAGNOSIS — S83241A Other tear of medial meniscus, current injury, right knee, initial encounter: Secondary | ICD-10-CM | POA: Diagnosis not present

## 2019-04-19 DIAGNOSIS — I10 Essential (primary) hypertension: Secondary | ICD-10-CM | POA: Insufficient documentation

## 2019-04-19 DIAGNOSIS — F329 Major depressive disorder, single episode, unspecified: Secondary | ICD-10-CM | POA: Insufficient documentation

## 2019-04-19 DIAGNOSIS — S83231A Complex tear of medial meniscus, current injury, right knee, initial encounter: Secondary | ICD-10-CM | POA: Insufficient documentation

## 2019-04-19 DIAGNOSIS — F411 Generalized anxiety disorder: Secondary | ICD-10-CM | POA: Diagnosis not present

## 2019-04-19 DIAGNOSIS — Z90710 Acquired absence of both cervix and uterus: Secondary | ICD-10-CM | POA: Insufficient documentation

## 2019-04-19 DIAGNOSIS — F418 Other specified anxiety disorders: Secondary | ICD-10-CM | POA: Diagnosis not present

## 2019-04-19 DIAGNOSIS — G4700 Insomnia, unspecified: Secondary | ICD-10-CM | POA: Insufficient documentation

## 2019-04-19 DIAGNOSIS — M19042 Primary osteoarthritis, left hand: Secondary | ICD-10-CM | POA: Diagnosis not present

## 2019-04-19 DIAGNOSIS — Z91040 Latex allergy status: Secondary | ICD-10-CM | POA: Insufficient documentation

## 2019-04-19 DIAGNOSIS — M1711 Unilateral primary osteoarthritis, right knee: Secondary | ICD-10-CM | POA: Diagnosis not present

## 2019-04-19 DIAGNOSIS — K219 Gastro-esophageal reflux disease without esophagitis: Secondary | ICD-10-CM | POA: Diagnosis not present

## 2019-04-19 DIAGNOSIS — M94261 Chondromalacia, right knee: Secondary | ICD-10-CM | POA: Diagnosis not present

## 2019-04-19 DIAGNOSIS — M2241 Chondromalacia patellae, right knee: Secondary | ICD-10-CM | POA: Diagnosis not present

## 2019-04-19 DIAGNOSIS — Z801 Family history of malignant neoplasm of trachea, bronchus and lung: Secondary | ICD-10-CM | POA: Insufficient documentation

## 2019-04-19 DIAGNOSIS — Z85828 Personal history of other malignant neoplasm of skin: Secondary | ICD-10-CM | POA: Diagnosis not present

## 2019-04-19 HISTORY — PX: CHONDROPLASTY: SHX5177

## 2019-04-19 HISTORY — DX: Sleep apnea, unspecified: G47.30

## 2019-04-19 HISTORY — PX: KNEE ARTHROSCOPY WITH MEDIAL MENISECTOMY: SHX5651

## 2019-04-19 LAB — BASIC METABOLIC PANEL
Anion gap: 12 (ref 5–15)
BUN: 8 mg/dL (ref 8–23)
CO2: 28 mmol/L (ref 22–32)
Calcium: 9.3 mg/dL (ref 8.9–10.3)
Chloride: 100 mmol/L (ref 98–111)
Creatinine, Ser: 0.75 mg/dL (ref 0.44–1.00)
GFR calc Af Amer: >60 mL/min (ref >60)
GFR calc non Af Amer: >60 mL/min (ref >60)
Glucose, Bld: 180 mg/dL — ABNORMAL HIGH (ref 70–99)
Potassium: 3.1 mmol/L — ABNORMAL LOW (ref 3.5–5.1)
Sodium: 140 mmol/L (ref 135–145)

## 2019-04-19 SURGERY — ARTHROSCOPY, KNEE, WITH MEDIAL MENISCECTOMY
Anesthesia: General | Site: Knee | Laterality: Right

## 2019-04-19 MED ORDER — BUPIVACAINE-EPINEPHRINE 0.5% -1:200000 IJ SOLN
INTRAMUSCULAR | Status: DC | PRN
  Administered 2019-04-19: 30 mL

## 2019-04-19 MED ORDER — FENTANYL CITRATE (PF) 100 MCG/2ML IJ SOLN
INTRAMUSCULAR | Status: DC | PRN
  Administered 2019-04-19 (×2): 50 ug via INTRAVENOUS

## 2019-04-19 MED ORDER — CEFAZOLIN SODIUM-DEXTROSE 2-4 GM/100ML-% IV SOLN
2.0000 g | INTRAVENOUS | Status: AC
  Administered 2019-04-19: 2 g via INTRAVENOUS

## 2019-04-19 MED ORDER — ACETAMINOPHEN 160 MG/5ML PO SOLN: 325.0000 mg | ORAL | Status: DC | PRN

## 2019-04-19 MED ORDER — FENTANYL CITRATE (PF) 100 MCG/2ML IJ SOLN: 50.0000 ug | INTRAMUSCULAR | Status: DC | PRN

## 2019-04-19 MED ORDER — PROPOFOL 10 MG/ML IV BOLUS
INTRAVENOUS | Status: AC
  Filled 2019-04-19: qty 20

## 2019-04-19 MED ORDER — OXYCODONE HCL 5 MG PO TABS
5.0000 mg | ORAL_TABLET | Freq: Once | ORAL | Status: AC | PRN
  Administered 2019-04-19: 10:00:00 5 mg via ORAL

## 2019-04-19 MED ORDER — METHYLPREDNISOLONE ACETATE 80 MG/ML IJ SUSP
INTRAMUSCULAR | Status: DC | PRN
  Administered 2019-04-19: 80 mg

## 2019-04-19 MED ORDER — MIDAZOLAM HCL 2 MG/2ML IJ SOLN
INTRAMUSCULAR | Status: AC
  Filled 2019-04-19: qty 2

## 2019-04-19 MED ORDER — METHYLPREDNISOLONE ACETATE 80 MG/ML IJ SUSP
INTRAMUSCULAR | Status: AC
  Filled 2019-04-19: qty 1

## 2019-04-19 MED ORDER — PROMETHAZINE HCL 12.5 MG PO TABS: 12.5000 mg | ORAL_TABLET | Freq: Four times a day (QID) | ORAL | 0 refills | Status: AC | PRN

## 2019-04-19 MED ORDER — SCOPOLAMINE 1 MG/3DAYS TD PT72: 1.0000 | MEDICATED_PATCH | Freq: Once | TRANSDERMAL | Status: DC

## 2019-04-19 MED ORDER — MIDAZOLAM HCL 5 MG/5ML IJ SOLN
INTRAMUSCULAR | Status: DC | PRN
  Administered 2019-04-19: 2 mg via INTRAVENOUS

## 2019-04-19 MED ORDER — OXYCODONE HCL 5 MG/5ML PO SOLN: 5.0000 mg | Freq: Once | ORAL | Status: AC | PRN

## 2019-04-19 MED ORDER — OXYCODONE HCL 5 MG PO TABS
ORAL_TABLET | ORAL | Status: AC
  Filled 2019-04-19: qty 1

## 2019-04-19 MED ORDER — LACTATED RINGERS IV SOLN
INTRAVENOUS | Status: DC
  Administered 2019-04-19: 07:00:00 via INTRAVENOUS

## 2019-04-19 MED ORDER — PROPOFOL 10 MG/ML IV BOLUS
INTRAVENOUS | Status: DC | PRN
  Administered 2019-04-19: 200 mg via INTRAVENOUS

## 2019-04-19 MED ORDER — FENTANYL CITRATE (PF) 100 MCG/2ML IJ SOLN: 25.0000 ug | INTRAMUSCULAR | Status: DC | PRN

## 2019-04-19 MED ORDER — ONDANSETRON HCL 4 MG/2ML IJ SOLN
INTRAMUSCULAR | Status: DC | PRN
  Administered 2019-04-19 (×2): 4 mg via INTRAVENOUS

## 2019-04-19 MED ORDER — MIDAZOLAM HCL 2 MG/2ML IJ SOLN: 1.0000 mg | INTRAMUSCULAR | Status: DC | PRN

## 2019-04-19 MED ORDER — SODIUM CHLORIDE 0.9 % IV SOLN: INTRAVENOUS | Status: DC

## 2019-04-19 MED ORDER — BUPIVACAINE-EPINEPHRINE (PF) 0.5% -1:200000 IJ SOLN
INTRAMUSCULAR | Status: AC
  Filled 2019-04-19: qty 30

## 2019-04-19 MED ORDER — CEFAZOLIN SODIUM-DEXTROSE 2-4 GM/100ML-% IV SOLN
INTRAVENOUS | Status: AC
  Filled 2019-04-19: qty 100

## 2019-04-19 MED ORDER — DEXAMETHASONE SODIUM PHOSPHATE 10 MG/ML IJ SOLN
INTRAMUSCULAR | Status: DC | PRN
  Administered 2019-04-19: 10 mg via INTRAVENOUS

## 2019-04-19 MED ORDER — FENTANYL CITRATE (PF) 100 MCG/2ML IJ SOLN
INTRAMUSCULAR | Status: AC
  Filled 2019-04-19: qty 2

## 2019-04-19 MED ORDER — LIDOCAINE 2% (20 MG/ML) 5 ML SYRINGE
INTRAMUSCULAR | Status: DC | PRN
  Administered 2019-04-19: 80 mg via INTRAVENOUS

## 2019-04-19 MED ORDER — ONDANSETRON HCL 4 MG/2ML IJ SOLN: 4.0000 mg | Freq: Once | INTRAMUSCULAR | Status: DC | PRN

## 2019-04-19 MED ORDER — ACETAMINOPHEN 325 MG PO TABS: 325.0000 mg | ORAL_TABLET | ORAL | Status: DC | PRN

## 2019-04-19 MED ORDER — HYDROCODONE-ACETAMINOPHEN 5-325 MG PO TABS: 1.0000 | ORAL_TABLET | ORAL | 0 refills | Status: AC | PRN

## 2019-04-19 MED ORDER — MEPERIDINE HCL 25 MG/ML IJ SOLN: 6.2500 mg | INTRAMUSCULAR | Status: DC | PRN

## 2019-04-19 MED ORDER — CHLORHEXIDINE GLUCONATE 4 % EX LIQD: 60.0000 mL | Freq: Once | CUTANEOUS | Status: DC

## 2019-04-19 MED ORDER — KETOROLAC TROMETHAMINE 30 MG/ML IJ SOLN
INTRAMUSCULAR | Status: DC | PRN
  Administered 2019-04-19: 30 mg via INTRAVENOUS

## 2019-04-19 SURGICAL SUPPLY — 38 items
BANDAGE ACE 6X5 VEL STRL LF (GAUZE/BANDAGES/DRESSINGS) IMPLANT
BANDAGE ESMARK 6X9 LF (GAUZE/BANDAGES/DRESSINGS) IMPLANT
BNDG CMPR 9X6 STRL LF SNTH (GAUZE/BANDAGES/DRESSINGS)
BNDG ESMARK 6X9 LF (GAUZE/BANDAGES/DRESSINGS)
BNDG GAUZE ELAST 4 BULKY (GAUZE/BANDAGES/DRESSINGS) ×2 IMPLANT
COVER WAND RF STERILE (DRAPES) IMPLANT
DISSECTOR 3.5MM X 13CM CVD (MISCELLANEOUS) IMPLANT
DRAPE ARTHROSCOPY W/POUCH 90 (DRAPES) ×2 IMPLANT
DRSG EMULSION OIL 3X3 NADH (GAUZE/BANDAGES/DRESSINGS) ×2 IMPLANT
DURAPREP 26ML APPLICATOR (WOUND CARE) ×2 IMPLANT
EXCALIBUR 3.8MM X 13CM (MISCELLANEOUS) ×2 IMPLANT
GAUZE SPONGE 4X4 12PLY STRL (GAUZE/BANDAGES/DRESSINGS) ×2 IMPLANT
GLOVE BIO SURGEON STRL SZ7.5 (GLOVE) IMPLANT
GLOVE BIOGEL PI IND STRL 6.5 (GLOVE) ×1 IMPLANT
GLOVE BIOGEL PI IND STRL 7.0 (GLOVE) ×1 IMPLANT
GLOVE BIOGEL PI IND STRL 8 (GLOVE) ×2 IMPLANT
GLOVE BIOGEL PI INDICATOR 6.5 (GLOVE) ×1
GLOVE BIOGEL PI INDICATOR 7.0 (GLOVE) ×1
GLOVE BIOGEL PI INDICATOR 8 (GLOVE) ×2
GLOVE SURG ORTHO 8.0 STRL STRW (GLOVE) IMPLANT
GOWN STRL REUS W/ TWL LRG LVL3 (GOWN DISPOSABLE) ×1 IMPLANT
GOWN STRL REUS W/ TWL XL LVL3 (GOWN DISPOSABLE) ×1 IMPLANT
GOWN STRL REUS W/TWL LRG LVL3 (GOWN DISPOSABLE) ×2
GOWN STRL REUS W/TWL XL LVL3 (GOWN DISPOSABLE) ×4 IMPLANT
HOLDER KNEE FOAM BLUE (MISCELLANEOUS) ×2 IMPLANT
KNEE WRAP E Z 3 GEL PACK (MISCELLANEOUS) ×2 IMPLANT
MANIFOLD NEPTUNE II (INSTRUMENTS) ×2 IMPLANT
NDL SAFETY ECLIPSE 18X1.5 (NEEDLE) ×1 IMPLANT
NEEDLE HYPO 18GX1.5 SHARP (NEEDLE) ×2
PACK ARTHROSCOPY DSU (CUSTOM PROCEDURE TRAY) ×2 IMPLANT
PACK BASIN DAY SURGERY FS (CUSTOM PROCEDURE TRAY) ×2 IMPLANT
PORT APPOLLO RF 90DEGREE MULTI (SURGICAL WAND) IMPLANT
PROBE BIPOLAR ARTHRO 85MM 30D (MISCELLANEOUS) IMPLANT
SUT ETHILON 4 0 PS 2 18 (SUTURE) ×2 IMPLANT
SYR 5ML LL (SYRINGE) ×2 IMPLANT
TOWEL GREEN STERILE FF (TOWEL DISPOSABLE) ×2 IMPLANT
TUBING ARTHROSCOPY IRRIG 16FT (MISCELLANEOUS) ×2 IMPLANT
WATER STERILE IRR 1000ML POUR (IV SOLUTION) ×2 IMPLANT

## 2019-04-19 NOTE — Interval H&P Note (Signed)
History and Physical Interval Note:  04/19/2019 7:37 AM  Whitney [name]  has presented today for surgery, with the diagnosis of TEAR OF MEDIAL MENISCUS RIGHT KLNEE,CHONDROMALACIA PATELLAE AND MONOARTHRITIS RIGHT KNEE.  The various methods of treatment have been discussed with the patient and family. After consideration of risks, benefits and other options for treatment, the patient has consented to  Procedure(s): KNEE ARTHROSCOPY WITH MEDIAL MENISECTOMY (Right) CHONDROPLASTY (Right) as a surgical intervention.  The patient's history has been reviewed, patient examined, no change in status, stable for surgery.  I have reviewed the patient's chart and labs.  Questions were answered to the patient's satisfaction.     Yvette Rack

## 2019-04-19 NOTE — Anesthesia Procedure Notes (Signed)
Procedure Name: LMA Insertion Date/Time: 04/19/2019 7:46 AM Performed by: Gwyndolyn Saxon, CRNA Pre-anesthesia Checklist: Patient identified, Emergency Drugs available, Suction available and Patient being monitored Patient Re-evaluated:Patient Re-evaluated prior to induction Oxygen Delivery Method: Circle system utilized Preoxygenation: Pre-oxygenation with 100% oxygen Induction Type: IV induction Ventilation: Mask ventilation without difficulty LMA: LMA inserted LMA Size: 4.0 Number of attempts: 1 Placement Confirmation: positive ETCO2 and breath sounds checked- equal and bilateral Tube secured with: Tape Dental Injury: Teeth and Oropharynx as per pre-operative assessment

## 2019-04-19 NOTE — Op Note (Signed)
Whitney [name], Whitney [name] MEDICAL RECORD [id number] ACCOUNT [id number] DATE OF BIRTH:1955-12-16 FACILITY: [facility] LOCATION: [facility] PHYSICIAN:W. Akacia Boltz JR., MD  OPERATIVE REPORT  DATE OF PROCEDURE:  04/19/2019  PREOPERATIVE DIAGNOSES:   1.  Complex tear medial meniscus. 2.  Grade III chondromalacia medial compartment, patellofemoral joint and lateral compartment.  POSTOPERATIVE DIAGNOSES: 1.  Complex tear medial meniscus. 2.  Grade III chondromalacia medial compartment, patellofemoral joint and lateral compartment.  OPERATION: 1.  Partial medial meniscectomy (40%). 2.  Debridement chondroplasty patellofemoral joint, medial and lateral compartments.  SURGEON:  Vangie Bicker, MD  ANESTHESIA:  General with local supplementation.  DESCRIPTION OF PROCEDURE:  She was arthroscoped through inferomedial and inferolateral portal.  Systematic inspection of the knee showed the patient to have moderate grade III chondromalacia of the majority of the patella.  Some involvement of the  trochlear groove debrided.  Lateral compartment, lateral meniscus, normal.  Lateral tibial plateau normal.  Early grade III changes on the lateral femoral condyle, debrided.  Medial compartment complex tear of the meniscus with more generalized changed  on the femoral condyle, still grade III medially with more involvement on the tibial plateau medially than laterally.  The posterior one half of the tibial plateau showed grade III lesion complex tear requiring resection of about 40% of the meniscus  substance.  Again, chondroplasty carried out in all, compartments.  Medial partial medial meniscectomy.  Knee drained free of fluid.  Portals were closed with nylon.  Joint was infiltrated with 10 mL of 0.5% Marcaine with addition of 80 mg Depo-Medrol  with additional 20 mL subcutaneously.  A lightly compressive sterile dressing applied, taken to recovery room in stable condition.  AN/NUANCE  D:04/19/2019  T:04/19/2019 JOB:[id number]

## 2019-04-19 NOTE — Anesthesia Postprocedure Evaluation (Signed)
Anesthesia Post Note  Patient: Whitney [name]  Procedure(s) Performed: KNEE ARTHROSCOPY WITH MEDIAL MENISECTOMY (Right Knee) CHONDROPLASTY (Right Knee)     Patient location during evaluation: PACU Anesthesia Type: General Level of consciousness: awake and alert Pain management: pain level controlled Vital Signs Assessment: post-procedure vital signs reviewed and stable Respiratory status: spontaneous breathing, nonlabored ventilation, respiratory function stable and patient connected to nasal cannula oxygen Cardiovascular status: blood pressure returned to baseline and stable Postop Assessment: no apparent nausea or vomiting Anesthetic complications: no    Last Vitals:  Vitals:   04/19/19 0858 04/19/19 0942  BP:  128/86  Pulse: 77 80  Resp: 12 16  Temp:  36.7 C  SpO2: 97% 94%    Last Pain:  Vitals:   04/19/19 0942  TempSrc: Oral  PainSc: 7                  Orvis Stann

## 2019-04-19 NOTE — Transfer of Care (Signed)
Immediate Anesthesia Transfer of Care Note  Patient: Whitney [name]  Procedure(s) Performed: KNEE ARTHROSCOPY WITH MEDIAL MENISECTOMY (Right Knee) CHONDROPLASTY (Right Knee)  Patient Location: PACU  Anesthesia Type:General  Level of Consciousness: drowsy  Airway & Oxygen Therapy: Patient Spontanous Breathing and Patient connected to face mask oxygen  Post-op Assessment: Report given to RN and Post -op Vital signs reviewed and stable  Post vital signs: Reviewed and stable  Last Vitals:  Vitals Value Taken Time  BP 120/78 04/19/19 0830  Temp    Pulse 82 04/19/19 0832  Resp 12 04/19/19 0832  SpO2 100 % 04/19/19 0832  Vitals shown include unvalidated device data.  Last Pain:  Vitals:   04/19/19 0639  TempSrc: Oral  PainSc: 6       Patients Stated Pain Goal: 6 ([id number])  Complications: No apparent anesthesia complications

## 2019-04-19 NOTE — Interval H&P Note (Signed)
History and Physical Interval Note:  04/19/2019 7:34 AM  Whitney [name]  has presented today for surgery, with the diagnosis of Calcium AND MONOARTHRITIS RIGHT KNEE.  The various methods of treatment have been discussed with the patient and family. After consideration of risks, benefits and other options for treatment, the patient has consented to  Procedure(s): KNEE ARTHROSCOPY WITH MEDIAL MENISECTOMY (Right) CHONDROPLASTY (Right) as a surgical intervention.  The patient's history has been reviewed, patient examined, no change in status, stable for surgery.  I have reviewed the patient's chart and labs.  Questions were answered to the patient's satisfaction.     Yvette Rack

## 2019-04-19 NOTE — Discharge Instructions (Signed)
5mg  oxycodone given at 9:45am, may take Norco at 1:45pm   Post Anesthesia Home Care Instructions  Activity: Get plenty of rest for the remainder of the day. A responsible individual must stay with you for 24 hours following the procedure.  For the next 24 hours, DO NOT: -Drive a car -Paediatric nurse -Drink alcoholic beverages -Take any medication unless instructed by your physician -Make any legal decisions or sign important papers.  Meals: Start with liquid foods such as gelatin or soup. Progress to regular foods as tolerated. Avoid greasy, spicy, heavy foods. If nausea and/or vomiting occur, drink only clear liquids until the nausea and/or vomiting subsides. Call your physician if vomiting continues.  Special Instructions/Symptoms: Your throat may feel dry or sore from the anesthesia or the breathing tube placed in your throat during surgery. If this causes discomfort, gargle with warm salt water. The discomfort should disappear within 24 hours.  If you had a scopolamine patch placed behind your ear for the management of post- operative nausea and/or vomiting:  1. The medication in the patch is effective for 72 hours, after which it should be removed.  Wrap patch in a tissue and discard in the trash. Wash hands thoroughly with soap and water. 2. You may remove the patch earlier than 72 hours if you experience unpleasant side effects which may include dry mouth, dizziness or visual disturbances. 3. Avoid touching the patch. Wash your hands with soap and water after contact with the patch.

## 2019-04-22 ENCOUNTER — Encounter (HOSPITAL_BASED_OUTPATIENT_CLINIC_OR_DEPARTMENT_OTHER): Payer: Self-pay | Admitting: Orthopedic Surgery

## 2019-04-24 ENCOUNTER — Other Ambulatory Visit: Payer: Self-pay

## 2019-04-24 ENCOUNTER — Ambulatory Visit: Payer: 59 | Attending: Orthopedic Surgery | Admitting: Physical Therapy

## 2019-04-24 ENCOUNTER — Encounter: Payer: Self-pay | Admitting: Physical Therapy

## 2019-04-24 DIAGNOSIS — R600 Localized edema: Secondary | ICD-10-CM | POA: Diagnosis not present

## 2019-04-24 DIAGNOSIS — M25561 Pain in right knee: Secondary | ICD-10-CM

## 2019-04-24 DIAGNOSIS — R262 Difficulty in walking, not elsewhere classified: Secondary | ICD-10-CM | POA: Diagnosis not present

## 2019-04-24 DIAGNOSIS — M25661 Stiffness of right knee, not elsewhere classified: Secondary | ICD-10-CM | POA: Diagnosis not present

## 2019-04-24 NOTE — Patient Instructions (Signed)
1 STEP 2 Supine Hamstring Stretch with Strap REPS: 3  SETS: 1  HOLD: 30  DAILY: 2  WEEKLY: 7 Setup Begin lying on your back with your legs straight, holding the end of a strap that is looped around one foot. Movement Use the strap to pull your leg up toward your body until you feel a gentle stretch in the back of your upper leg. Hold this position. Tip Make sure to keep your other leg straight on the ground during the stretch. STEP 1 STEP 2 Supine Quad Set REPS: 10  SETS: 2  HOLD: 5  DAILY: 2  WEEKLY: 7 Setup Begin lying on your back with one knee bent and your other leg straight with your knee resting on a towel roll. Movement Gently squeeze your thigh muscles, pushing the back of your knee down into the towel. Tip Make sure to keep your back flat against the floor during the exercise. STEP 1 STEP 2 Supine Active Straight Leg Raise REPS: 10  SETS: 2  HOLD: 5  DAILY: 2  WEEKLY: 7 Do one set with your toes turned out, keep leg close to midline move slowly and with control Setup Begin lying on your back with one knee bent and your other leg straight. Movement Engaging your thigh muscles, slowly lift your straight leg until it is parallel with your other thigh, then lower it back to the starting position and repeat. Tip Make sure to keep your leg straight and do not let your back arch during the exercise. STEP 1 STEP 2 Sidelying Hip Abduction REPS: 10  SETS: 2  HOLD: 5  DAILY: 2  WEEKLY: 7 Setup Begin lying on your side with your top leg straight and your bottom leg bent. Movement Lift your top leg up toward the ceiling, then slowly lower it back down and repeat. Tip Make sure to keep your leg straight and do not let your hips roll backward or forward during the exercise. Prepared By: [place], [place]

## 2019-04-24 NOTE — Therapy (Signed)
[place], [place], [address] Phone: [phone]   Fax:  [phone]  Physical Therapy Evaluation  Patient Details  Name: Whitney [name] MRN: [id number] Date of Birth: April 05, 1956 Referring Provider (PT): Dr. French Ana    Encounter Date: 04/24/2019  PT End of Session - 04/24/19 1054    Visit Number  1    Number of Visits  16    Date for PT Re-Evaluation  06/19/19    PT Start Time  1000    PT Stop Time  1046    PT Time Calculation (min)  46 min    Activity Tolerance  Patient tolerated treatment well    Behavior During Therapy  [facility] for tasks assessed/performed       Past Medical History:  Diagnosis Date  . Arthritis    hands  . Basal cell carcinoma of face   . Depression   . GAD (generalized anxiety disorder)   . GERD (gastroesophageal reflux disease)   . Hypertension   . Insomnia   . Pneumonia 07/27/2016   Bacterial  . Sleep apnea    mild, no cpap    Past Surgical History:  Procedure Laterality Date  . ABDOMINAL HYSTERECTOMY N/A 10/11/2016   Procedure: HYSTERECTOMY TOTAL  ABDOMINAL;  Surgeon: Linda Hedges, DO;  Location: [facility];  Service: Gynecology;  Laterality: N/A;  . CESAREAN SECTION     x2  . CHONDROPLASTY Right 04/19/2019   Procedure: CHONDROPLASTY;  Surgeon: Earlie Server, MD;  Location: [facility];  Service: Orthopedics;  Laterality: Right;  . COLONOSCOPY    . DILATION AND CURETTAGE OF UTERUS    . KNEE ARTHROSCOPY WITH MEDIAL MENISECTOMY Right 04/19/2019   Procedure: KNEE ARTHROSCOPY WITH MEDIAL MENISECTOMY;  Surgeon: Earlie Server, MD;  Location: [facility];  Service: Orthopedics;  Laterality: Right;  . SALPINGOOPHORECTOMY Bilateral 10/11/2016   Procedure: BILATERAL SALPINGO OOPHORECTOMY;  Surgeon: Linda Hedges, DO;  Location: [facility];  Service: Gynecology;  Laterality: Bilateral;  . TENDON TRANSFER  08/30/2012   Procedure: TENDON TRANSFER;   Surgeon: Roseanne Kaufman, MD;  Location: [facility];  Service: Orthopedics;  Laterality: Left;  LEFT THUMB EXTENSOR INDICUS POLLICUS TO EXTENSOR POLLICUS LONGUS TENDON TRANSFER GENERAL WITH BLOCK  . WISDOM TOOTH EXTRACTION      There were no vitals filed for this visit.   Subjective Assessment - 04/24/19 1005    Subjective  Patient presents s/p Rt knee meniscectomy and chondroplasty by Dr. French Ana.  She has had this problem for a few months, had injections and ultimately had surgey 04/19/19.  She has difficulty negotiating stairs, walking more than short distances.  Her knee is very sensitive.  She has mild swelling.    Pertinent History  arthritis, anxiety, HTN, h/o basil cell carcinoma.Partial medial meniscectomy (40%).Debridement chondroplasty patellofemoral jointmedial and lateral compartments.    Limitations  Standing;Lifting;Walking;House hold activities    Diagnostic tests  had chondromalacia medial, lateral and PF , complex tear of medial meniscus    Patient Stated Goals  Return to work and walking as she normally does.  Normally walks for about 2 miles.    Currently in Pain?  Yes    Pain Score  3     Pain Location  Knee    Pain Orientation  Right;Medial    Pain Descriptors / Indicators  Sore;Tender    Pain Type  Surgical pain    Pain Onset  In the past 7 days  Pain Frequency  Intermittent    Aggravating Factors   walking, stairs    Pain Relieving Factors  ice, done with pain meds    Effect of Pain on Daily Activities  sleep and mobility    Multiple Pain Sites  No         [facility] PT Assessment - 04/24/19 0001      Assessment   Medical Diagnosis  Rt knee medial m    Referring Provider (PT)  Dr. French Ana     Onset Date/Surgical Date  04/19/19    Next MD Visit  05/02/19    Prior Therapy  No       Precautions   Precautions  Other (comment)    Precaution Comments  latex       Restrictions   Weight Bearing Restrictions  No      Balance Screen   Has the  patient fallen in the past 6 months  No      Day residence    Living Arrangements  Spouse/significant other    Type of [place] to enter    Entrance Stairs-Number of Steps  4    Entrance Stairs-Rails  Right;Left    Home Layout  Two level    Alternate Level Stairs-Number of Steps  17    Alternate Level Stairs-Rails  Right;Left      Prior Function   Vocation  Part time employment    Leisure  being outdoors      Cognition   Overall Cognitive Status  Within Functional Limits for tasks assessed      Observation/Other Assessments   Focus on Therapeutic Outcomes (FOTO)   49%      Circumferential Edema   Circumferential - Right  14 1/8 inch     Circumferential - Left   14 inch       Sensation   Light Touch  Appears Intact      Functional Tests   Functional tests  Squat;Single leg stance      Squat   Comments  good form       Single Leg Stance   Comments  SLS 15 sec bilaterally, less stable in Rt LE no LOB       Posture/Postural Control   Posture Comments  not remarkable       AROM   Right Knee Extension  5   11 deg quad lag    Right Knee Flexion  109    Left Knee Extension  3    Left Knee Flexion  138      Strength   Right Hip Flexion  4/5    Right Hip ABduction  4-/5    Left Hip Flexion  5/5    Left Hip ABduction  4+/5    Right Knee Flexion  5/5    Right Knee Extension  4+/5    Left Knee Flexion  5/5    Left Knee Extension  5/5      Flexibility   Hamstrings  Rt 90/90 > 25 deg , tight       Palpation   Patella mobility  hypombile, tender         Objective measurements completed on examination: See above findings.     PT Education - 04/24/19 1054    Education Details  PT, POC, HEP, prognosis    Person(s) Educated  Patient    Methods  Explanation;Handout;Verbal cues  Comprehension  Verbalized understanding;Returned demonstration       PT Short Term Goals - 04/24/19 1059       PT SHORT TERM GOAL #1   Title  Pt will be I with HEP for ROM and strength of Rt knee.    Time  4    Period  Weeks    Status  New    Target Date  05/22/19      PT SHORT TERM GOAL #2   Title  Pt will be able to return to work with min pain in Rt knee, min swelling.    Time  4    Period  Weeks    Status  New    Target Date  05/22/19      PT SHORT TERM GOAL #3   Title  Pt will be able to walk as needed in the community with pain < 5/10    Time  4    Period  Weeks    Status  New    Target Date  05/22/19        PT Long Term Goals - 04/24/19 1102      PT LONG TERM GOAL #1   Title  Pt with score 32% or less on FOTO to show functional mobility improvements.    Time  8    Period  Weeks    Status  New    Target Date  06/19/19      PT LONG TERM GOAL #2   Title  Pt will be able to negotiate 17 steps without rails reciprocally and no increased pain    Time  8    Period  Weeks    Status  New    Target Date  06/19/19      PT LONG TERM GOAL #3   Title  Pt will be able to squat without pain in knee.    Time  8    Period  Weeks    Status  New    Target Date  06/19/19      PT LONG TERM GOAL #4   Title  Pt will return to walking for fitness, up to 2 miles with min discomfort, relieved by rest    Time  8    Period  Weeks    Status  New    Target Date  06/19/19      PT LONG TERM GOAL #5   Title  AROM Rt knee flexion to 125 deg or more    Time  8    Period  Weeks    Status  New    Target Date  06/19/19             Plan - 04/24/19 1055    Clinical Impression Statement  Patient presents for low complexity eval of Rt knee pain s/p meniscectomy and chondroplasty on 04/19/19.  She is doing well, pain with walking can be mod to severe.  She has been doing minimal activities , stretching at home as appropriate. Hopes to ease back into work in the coming weeks but still  limited in ROM and tenderness in medial line of knee.  Strength is decent and she should do very well.     Examination-Activity Limitations  Squat;Stairs;Stand;Locomotion Level;Transfers;Sleep    Examination-Participation Restrictions  Yard Work;Community Activity    Stability/Clinical Decision Making  Stable/Uncomplicated    Clinical Decision Making  Low    Rehab Potential  Excellent    PT Frequency  2x /  week    PT Duration  8 weeks    PT Treatment/Interventions  ADLs/Self Care Home Management;Electrical Stimulation;Therapeutic activities;Patient/family education;Taping;Therapeutic exercise;Vasopneumatic Device;Passive range of motion;Neuromuscular re-education;Stair training;Moist Heat;Cryotherapy;Functional mobility training;Manual techniques    PT Next Visit Plan  check HEP, recumbant bike, AROM Rt knee flex/ext    PT Home Exercise Plan  Hamstring, quad set, SLR/VMO and hip abd    Consulted and Agree with Plan of Care  Patient       Patient will benefit from skilled therapeutic intervention in order to improve the following deficits and impairments:  Increased fascial restricitons, Decreased scar mobility, Decreased mobility, Decreased range of motion, Decreased strength, Hypomobility, Impaired flexibility, Increased edema, Difficulty walking  Visit Diagnosis: 1. Acute pain of right knee   2. Stiffness of right knee, not elsewhere classified   3. Localized edema   4. Difficulty in walking, not elsewhere classified        Problem List Patient Active Problem List   Diagnosis Date Noted  . S/P abdominal hysterectomy 10/11/2016  . Multiple pulmonary nodules 05/22/2013  . DOE (dyspnea on exertion) 05/21/2013    PAA,JENNIFER 04/24/2019, 4:24 PM  [facility] [address] [place], [place], [address] Phone: [phone]   Fax:  [phone]  Name: Whitney [name] MRN: [id number] Date of Birth: 10-13-1956   Raeford Razor, PT 04/24/19 4:24 PM Phone: [phone] Fax: [phone]

## 2019-04-24 NOTE — Therapy (Deleted)
[place], [place], [address] Phone: [phone]   Fax:  [phone]  Physical Therapy Treatment  Patient Details  Name: Whitney [name] MRN: [id number] Date of Birth: [birth year] Referring Provider (PT): Dr. French Ana    Encounter Date: 04/24/2019  PT End of Session - 04/24/19 1054    Visit Number  1    Number of Visits  16    Date for PT Re-Evaluation  06/19/19    PT Start Time  1000    PT Stop Time  1046    PT Time Calculation (min)  46 min    Activity Tolerance  Patient tolerated treatment well    Behavior During Therapy  [facility] for tasks assessed/performed       Past Medical History:  Diagnosis Date  . Arthritis    hands  . Basal cell carcinoma of face   . Depression   . GAD (generalized anxiety disorder)   . GERD (gastroesophageal reflux disease)   . Hypertension   . Insomnia   . Pneumonia 07/27/2016   Bacterial  . Sleep apnea    mild, no cpap    Past Surgical History:  Procedure Laterality Date  . ABDOMINAL HYSTERECTOMY N/A 10/11/2016   Procedure: HYSTERECTOMY TOTAL  ABDOMINAL;  Surgeon: Linda Hedges, DO;  Location: [facility];  Service: Gynecology;  Laterality: N/A;  . CESAREAN SECTION     x2  . CHONDROPLASTY Right 04/19/2019   Procedure: CHONDROPLASTY;  Surgeon: Earlie Server, MD;  Location: [facility];  Service: Orthopedics;  Laterality: Right;  . COLONOSCOPY    . DILATION AND CURETTAGE OF UTERUS    . KNEE ARTHROSCOPY WITH MEDIAL MENISECTOMY Right 04/19/2019   Procedure: KNEE ARTHROSCOPY WITH MEDIAL MENISECTOMY;  Surgeon: Earlie Server, MD;  Location: [place];  Service: Orthopedics;  Laterality: Right;  . SALPINGOOPHORECTOMY Bilateral 10/11/2016   Procedure: BILATERAL SALPINGO OOPHORECTOMY;  Surgeon: Linda Hedges, DO;  Location: [facility];  Service: Gynecology;  Laterality: Bilateral;  . TENDON TRANSFER  08/30/2012   Procedure: TENDON TRANSFER;   Surgeon: Roseanne Kaufman, MD;  Location: [facility];  Service: Orthopedics;  Laterality: Left;  LEFT THUMB EXTENSOR INDICUS POLLICUS TO EXTENSOR POLLICUS LONGUS TENDON TRANSFER GENERAL WITH BLOCK  . WISDOM TOOTH EXTRACTION      There were no vitals filed for this visit.  Subjective Assessment - 04/24/19 1005    Subjective  Patient presents s/p Rt knee meniscectomy and chondroplasty by Dr. French Ana.  She has had this problem for a few months, had injections and ultimately had surgey 04/19/19.  She has difficulty negotiating stairs, walking more than short distances.  Her knee is very sensitive.  She has mild swelling.    Pertinent History  arthritis, anxiety, HTN, h/o basil cell carcinoma.Partial medial meniscectomy (40%).Debridement chondroplasty patellofemoral jointmedial and lateral compartments.    Limitations  Standing;Lifting;Walking;House hold activities    Diagnostic tests  had chondromalacia medial, lateral and PF , complex tear of medial meniscus    Patient Stated Goals  Return to work and walking as she normally does.  Normally walks for about 2 miles.    Currently in Pain?  Yes    Pain Score  3     Pain Location  Knee    Pain Orientation  Right;Medial    Pain Descriptors / Indicators  Sore;Tender    Pain Type  Surgical pain    Pain Onset  In the past 7 days  Pain Frequency  Intermittent    Aggravating Factors   walking, stairs    Pain Relieving Factors  ice, done with pain meds    Effect of Pain on Daily Activities  sleep and mobility    Multiple Pain Sites  No         [facility] PT Assessment - 04/24/19 0001      Assessment   Medical Diagnosis  Rt knee medial m    Referring Provider (PT)  Dr. French Ana     Onset Date/Surgical Date  04/19/19    Next MD Visit  05/02/19    Prior Therapy  No       Precautions   Precautions  Other (comment)    Precaution Comments  latex       Restrictions   Weight Bearing Restrictions  No      Balance Screen   Has the patient  fallen in the past 6 months  No      [place] residence    Living Arrangements  Spouse/significant other    Type of [place] to enter    Entrance Stairs-Number of Steps  4    [place]  Two level    Alternate Level Stairs-Number of Steps  17    Alternate Level Stairs-Rails  Right;Left      Prior Function   Vocation  Part time employment    Leisure  being outdoors      Cognition   Overall Cognitive Status  Within Functional Limits for tasks assessed      Observation/Other Assessments   Focus on Therapeutic Outcomes (FOTO)   49%      Circumferential Edema   Circumferential - Right  14 1/8 inch     Circumferential - Left   14 inch       Sensation   Light Touch  Appears Intact      Functional Tests   Functional tests  Squat;Single leg stance      Squat   Comments  good form       Single Leg Stance   Comments  SLS 15 sec bilaterally, less stable in Rt LE no LOB       Posture/Postural Control   Posture Comments  not remarkable       AROM   Right Knee Extension  5   11 deg quad lag    Right Knee Flexion  109    Left Knee Extension  3    Left Knee Flexion  138      Strength   Right Hip Flexion  4/5    Right Hip ABduction  4-/5    Left Hip Flexion  5/5    Left Hip ABduction  4+/5    Right Knee Flexion  5/5    Right Knee Extension  4+/5    Left Knee Flexion  5/5    Left Knee Extension  5/5      Flexibility   Hamstrings  Rt 90/90 > 25 deg , tight       Palpation   Patella mobility  hypombile, tender                            PT Education - 04/24/19 1054    Education Details  PT, POC, HEP, prognosis    Person(s) Educated  Patient  Methods  Explanation;Handout;Verbal cues    Comprehension  Verbalized understanding;Returned demonstration       PT Short Term Goals - 04/24/19 1059      PT SHORT TERM GOAL #1   Title  Pt will be I  with HEP for ROM and strength of Rt knee.    Time  4    Period  Weeks    Status  New    Target Date  05/22/19      PT SHORT TERM GOAL #2   Title  Pt will be able to return to work with min pain in Rt knee, min swelling.    Time  4    Period  Weeks    Status  New    Target Date  05/22/19      PT SHORT TERM GOAL #3   Title  Pt will be able to walk as needed in the community with pain < 5/10    Time  4    Period  Weeks    Status  New    Target Date  05/22/19        PT Long Term Goals - 04/24/19 1102      PT LONG TERM GOAL #1   Title  Pt with score 32% or less on FOTO to show functional mobility improvements.    Time  8    Period  Weeks    Status  New    Target Date  06/19/19      PT LONG TERM GOAL #2   Title  Pt will be able to negotiate 17 steps without rails reciprocally and no increased pain    Time  8    Period  Weeks    Status  New    Target Date  06/19/19      PT LONG TERM GOAL #3   Title  Pt will be able to squat without pain in knee.    Time  8    Period  Weeks    Status  New    Target Date  06/19/19      PT LONG TERM GOAL #4   Title  Pt will return to walking for fitness, up to 2 miles with min discomfort, relieved by rest    Time  8    Period  Weeks    Status  New    Target Date  06/19/19      PT LONG TERM GOAL #5   Title  AROM Rt knee flexion to 125 deg or more    Time  8    Period  Weeks    Status  New    Target Date  06/19/19            Plan - 04/24/19 1055    Clinical Impression Statement  Patient presents for low complexity eval of Rt knee pain s/p meniscectomy and chondroplasty on 04/19/19.  She is doing well, pain with walking can be mod to severe.  She has been doing minimal activities , stretching at home as appropriate. Hopes to ease back into work in the coming weeks but still  limited in ROM and tenderness in medial line of knee.  Strength is decent and she should do very well.    Examination-Activity Limitations   Squat;Stairs;Stand;Locomotion Level;Transfers;Sleep    Examination-Participation Restrictions  Yard Work;Community Activity    Stability/Clinical Decision Making  Stable/Uncomplicated    Clinical Decision Making  Low    Rehab Potential  Excellent  PT Frequency  2x / week    PT Duration  8 weeks    PT Treatment/Interventions  ADLs/Self Care Home Management;Electrical Stimulation;Therapeutic activities;Patient/family education;Taping;Therapeutic exercise;Vasopneumatic Device;Passive range of motion;Neuromuscular re-education;Stair training;Moist Heat;Cryotherapy;Functional mobility training;Manual techniques    PT Next Visit Plan  check HEP, recumbant bike, AROM Rt knee flex/ext    PT Home Exercise Plan  Hamstring, quad set, SLR/VMO and hip abd    Consulted and Agree with Plan of Care  Patient       Patient will benefit from skilled therapeutic intervention in order to improve the following deficits and impairments:  Increased fascial restricitons, Decreased scar mobility, Decreased mobility, Decreased range of motion, Decreased strength, Hypomobility, Impaired flexibility, Increased edema, Difficulty walking  Visit Diagnosis: 1. Acute pain of right knee   2. Stiffness of right knee, not elsewhere classified   3. Localized edema   4. Difficulty in walking, not elsewhere classified        Problem List Patient Active Problem List   Diagnosis Date Noted  . S/P abdominal hysterectomy 10/11/2016  . Multiple pulmonary nodules 05/22/2013  . DOE (dyspnea on exertion) 05/21/2013    PAA,JENNIFER 04/24/2019, 4:23 PM  [place] [facility] [address] [place], [place], [address] Phone: [phone]   Fax:  [phone]  Name: Whitney [name] MRN: [id number] Date of Birth: 08-Sep-1956

## 2019-04-25 DIAGNOSIS — H04123 Dry eye syndrome of bilateral lacrimal glands: Secondary | ICD-10-CM | POA: Diagnosis not present

## 2019-04-25 DIAGNOSIS — K219 Gastro-esophageal reflux disease without esophagitis: Secondary | ICD-10-CM | POA: Diagnosis not present

## 2019-04-25 DIAGNOSIS — R7301 Impaired fasting glucose: Secondary | ICD-10-CM | POA: Diagnosis not present

## 2019-04-25 DIAGNOSIS — E782 Mixed hyperlipidemia: Secondary | ICD-10-CM | POA: Diagnosis not present

## 2019-04-25 DIAGNOSIS — F411 Generalized anxiety disorder: Secondary | ICD-10-CM | POA: Diagnosis not present

## 2019-04-25 DIAGNOSIS — I10 Essential (primary) hypertension: Secondary | ICD-10-CM | POA: Diagnosis not present

## 2019-04-25 DIAGNOSIS — E7800 Pure hypercholesterolemia, unspecified: Secondary | ICD-10-CM | POA: Diagnosis not present

## 2019-04-26 DIAGNOSIS — R7301 Impaired fasting glucose: Secondary | ICD-10-CM | POA: Diagnosis not present

## 2019-04-26 DIAGNOSIS — R600 Localized edema: Secondary | ICD-10-CM | POA: Diagnosis not present

## 2019-04-26 DIAGNOSIS — E782 Mixed hyperlipidemia: Secondary | ICD-10-CM | POA: Diagnosis not present

## 2019-04-26 DIAGNOSIS — Z79899 Other long term (current) drug therapy: Secondary | ICD-10-CM | POA: Diagnosis not present

## 2019-04-26 DIAGNOSIS — R635 Abnormal weight gain: Secondary | ICD-10-CM | POA: Diagnosis not present

## 2019-04-26 DIAGNOSIS — F411 Generalized anxiety disorder: Secondary | ICD-10-CM | POA: Diagnosis not present

## 2019-04-26 DIAGNOSIS — G4701 Insomnia due to medical condition: Secondary | ICD-10-CM | POA: Diagnosis not present

## 2019-04-26 DIAGNOSIS — K529 Noninfective gastroenteritis and colitis, unspecified: Secondary | ICD-10-CM | POA: Diagnosis not present

## 2019-04-26 DIAGNOSIS — K219 Gastro-esophageal reflux disease without esophagitis: Secondary | ICD-10-CM | POA: Diagnosis not present

## 2019-04-26 DIAGNOSIS — I10 Essential (primary) hypertension: Secondary | ICD-10-CM | POA: Diagnosis not present

## 2019-05-01 ENCOUNTER — Ambulatory Visit: Payer: 59 | Attending: Orthopedic Surgery | Admitting: Physical Therapy

## 2019-05-01 ENCOUNTER — Other Ambulatory Visit: Payer: Self-pay

## 2019-05-01 ENCOUNTER — Encounter: Payer: Self-pay | Admitting: Physical Therapy

## 2019-05-01 DIAGNOSIS — M25661 Stiffness of right knee, not elsewhere classified: Secondary | ICD-10-CM | POA: Insufficient documentation

## 2019-05-01 DIAGNOSIS — R262 Difficulty in walking, not elsewhere classified: Secondary | ICD-10-CM | POA: Insufficient documentation

## 2019-05-01 DIAGNOSIS — R600 Localized edema: Secondary | ICD-10-CM | POA: Diagnosis not present

## 2019-05-01 DIAGNOSIS — M25561 Pain in right knee: Secondary | ICD-10-CM | POA: Diagnosis not present

## 2019-05-01 NOTE — Therapy (Signed)
[place], [place], [address] Phone: [phone]   Fax:  [phone]  Physical Therapy Treatment  Patient Details  Name: Whitney [name] MRN: [id number] Date of Birth: 05/20/1956 Referring Provider (PT): Dr. French Ana    Encounter Date: 05/01/2019  PT End of Session - 05/01/19 0844    Visit Number  2    Number of Visits  16    Date for PT Re-Evaluation  06/19/19    PT Start Time  0838    PT Stop Time  0916    PT Time Calculation (min)  38 min       Past Medical History:  Diagnosis Date  . Arthritis    hands  . Basal cell carcinoma of face   . Depression   . GAD (generalized anxiety disorder)   . GERD (gastroesophageal reflux disease)   . Hypertension   . Insomnia   . Pneumonia 07/27/2016   Bacterial  . Sleep apnea    mild, no cpap    Past Surgical History:  Procedure Laterality Date  . ABDOMINAL HYSTERECTOMY N/A 10/11/2016   Procedure: HYSTERECTOMY TOTAL  ABDOMINAL;  Surgeon: Linda Hedges, DO;  Location: [facility];  Service: Gynecology;  Laterality: N/A;  . CESAREAN SECTION     x2  . CHONDROPLASTY Right 04/19/2019   Procedure: CHONDROPLASTY;  Surgeon: Earlie Server, MD;  Location: [facility];  Service: Orthopedics;  Laterality: Right;  . COLONOSCOPY    . DILATION AND CURETTAGE OF UTERUS    . KNEE ARTHROSCOPY WITH MEDIAL MENISECTOMY Right 04/19/2019   Procedure: KNEE ARTHROSCOPY WITH MEDIAL MENISECTOMY;  Surgeon: Earlie Server, MD;  Location: [facility];  Service: Orthopedics;  Laterality: Right;  . SALPINGOOPHORECTOMY Bilateral 10/11/2016   Procedure: BILATERAL SALPINGO OOPHORECTOMY;  Surgeon: Linda Hedges, DO;  Location: [facility];  Service: Gynecology;  Laterality: Bilateral;  . TENDON TRANSFER  08/30/2012   Procedure: TENDON TRANSFER;  Surgeon: Roseanne Kaufman, MD;  Location: [facility];  Service: Orthopedics;  Laterality: Left;  LEFT THUMB  EXTENSOR INDICUS POLLICUS TO EXTENSOR POLLICUS LONGUS TENDON TRANSFER GENERAL WITH BLOCK  . WISDOM TOOTH EXTRACTION      There were no vitals filed for this visit.      [facility] PT Assessment - 05/01/19 0001      AROM   Right Knee Flexion  130                   [facility] Adult PT Treatment/Exercise - 05/01/19 0001      Knee/Hip Exercises: Stretches   Active Hamstring Stretch  3 reps;30 seconds    Active Hamstring Stretch Limitations  supine with strap       Knee/Hip Exercises: Aerobic   Recumbent Bike  L2 x 5 min      Knee/Hip Exercises: Seated   Long Arc Quad  20 reps    Long Arc Quad Weight  3 lbs.    Hamstring Curl  20 reps    Hamstring Limitations  red band       Knee/Hip Exercises: Supine   Quad Sets  20 reps    Short Arc Quad Sets  20 reps    Short Arc Quad Sets Limitations  with ball squeeze , also over bolster with 3# x 20     Bridges with Diona Foley Squeeze  --    Constance Haw with Clamshell  20 reps    Straight Leg Raise with External Rotation  20 reps  Knee/Hip Exercises: Sidelying   Hip ABduction  10 reps;2 sets    Clams  x 20       Knee/Hip Exercises: Prone   Hamstring Curl  1 set;20 reps    Hamstring Curl Limitations  3#    Hip Extension  20 reps    Other Prone Exercises  prone hip ext with knee flexion x 20                PT Short Term Goals - 04/24/19 1059      PT SHORT TERM GOAL #1   Title  Pt will be I with HEP for ROM and strength of Rt knee.    Time  4    Period  Weeks    Status  New    Target Date  05/22/19      PT SHORT TERM GOAL #2   Title  Pt will be able to return to work with min pain in Rt knee, min swelling.    Time  4    Period  Weeks    Status  New    Target Date  05/22/19      PT SHORT TERM GOAL #3   Title  Pt will be able to walk as needed in the community with pain < 5/10    Time  4    Period  Weeks    Status  New    Target Date  05/22/19        PT Long Term Goals - 04/24/19 1102      PT LONG TERM  GOAL #1   Title  Pt with score 32% or less on FOTO to show functional mobility improvements.    Time  8    Period  Weeks    Status  New    Target Date  06/19/19      PT LONG TERM GOAL #2   Title  Pt will be able to negotiate 17 steps without rails reciprocally and no increased pain    Time  8    Period  Weeks    Status  New    Target Date  06/19/19      PT LONG TERM GOAL #3   Title  Pt will be able to squat without pain in knee.    Time  8    Period  Weeks    Status  New    Target Date  06/19/19      PT LONG TERM GOAL #4   Title  Pt will return to walking for fitness, up to 2 miles with min discomfort, relieved by rest    Time  8    Period  Weeks    Status  New    Target Date  06/19/19      PT LONG TERM GOAL #5   Title  AROM Rt knee flexion to 125 deg or more    Time  8    Period  Weeks    Status  New    Target Date  06/19/19            Plan - 05/01/19 0900    Clinical Impression Statement  Pt reports no pain now. She does have soreness medial and lateral knee at end of day. She sees MD tomorrow and will hopefully have stitches removed. She uses ice regularly.  Knee AROM improved. Completed all open chain exercises without c/o pain.    PT Next Visit Plan  bike, did she get  stitches out? Did she RTW? instruct in scar massage, give closed chain. Needs to schedule 1 more? check goals.    PT Home Exercise Plan  Hamstring, quad set, SLR/VMO and hip abd    Consulted and Agree with Plan of Care  Patient       Patient will benefit from skilled therapeutic intervention in order to improve the following deficits and impairments:  Increased fascial restricitons, Decreased scar mobility, Decreased mobility, Decreased range of motion, Decreased strength, Hypomobility, Impaired flexibility, Increased edema, Difficulty walking  Visit Diagnosis: 1. Acute pain of right knee   2. Stiffness of right knee, not elsewhere classified   3. Localized edema   4. Difficulty in walking,  not elsewhere classified        Problem List Patient Active Problem List   Diagnosis Date Noted  . S/P abdominal hysterectomy 10/11/2016  . Multiple pulmonary nodules 05/22/2013  . DOE (dyspnea on exertion) 05/21/2013    Dorene Ar, PTA 05/01/2019, 10:05 AM  [place], [place], [address] Phone: [phone]   Fax:  [phone]  Name: Whitney [name] MRN: [id number] Date of Birth: 1955/11/05

## 2019-05-02 DIAGNOSIS — S83241D Other tear of medial meniscus, current injury, right knee, subsequent encounter: Secondary | ICD-10-CM | POA: Diagnosis not present

## 2019-05-15 ENCOUNTER — Encounter: Payer: Self-pay | Admitting: Physical Therapy

## 2019-05-15 ENCOUNTER — Other Ambulatory Visit: Payer: Self-pay

## 2019-05-15 ENCOUNTER — Ambulatory Visit: Payer: 59 | Admitting: Physical Therapy

## 2019-05-15 DIAGNOSIS — M25661 Stiffness of right knee, not elsewhere classified: Secondary | ICD-10-CM | POA: Diagnosis not present

## 2019-05-15 DIAGNOSIS — E782 Mixed hyperlipidemia: Secondary | ICD-10-CM | POA: Diagnosis not present

## 2019-05-15 DIAGNOSIS — R262 Difficulty in walking, not elsewhere classified: Secondary | ICD-10-CM

## 2019-05-15 DIAGNOSIS — M25561 Pain in right knee: Secondary | ICD-10-CM | POA: Diagnosis not present

## 2019-05-15 DIAGNOSIS — E876 Hypokalemia: Secondary | ICD-10-CM | POA: Diagnosis not present

## 2019-05-15 DIAGNOSIS — I10 Essential (primary) hypertension: Secondary | ICD-10-CM | POA: Diagnosis not present

## 2019-05-15 DIAGNOSIS — R600 Localized edema: Secondary | ICD-10-CM

## 2019-05-15 DIAGNOSIS — R7303 Prediabetes: Secondary | ICD-10-CM | POA: Diagnosis not present

## 2019-05-15 NOTE — Therapy (Signed)
[place], [place], [address] Phone: [phone]   Fax:  [phone]  Physical Therapy Treatment  Patient Details  Name: Whitney [name] MRN: [id number] Date of Birth: 05-Jan-1956 Referring Provider (PT): Dr. French Ana    Encounter Date: 05/15/2019  PT End of Session - 05/15/19 1004    Visit Number  3    Number of Visits  16    Date for PT Re-Evaluation  06/19/19    PT Start Time  1000    PT Stop Time  1040    PT Time Calculation (min)  40 min       Past Medical History:  Diagnosis Date  . Arthritis    hands  . Basal cell carcinoma of face   . Depression   . GAD (generalized anxiety disorder)   . GERD (gastroesophageal reflux disease)   . Hypertension   . Insomnia   . Pneumonia 07/27/2016   Bacterial  . Sleep apnea    mild, no cpap    Past Surgical History:  Procedure Laterality Date  . ABDOMINAL HYSTERECTOMY N/A 10/11/2016   Procedure: HYSTERECTOMY TOTAL  ABDOMINAL;  Surgeon: Linda Hedges, DO;  Location: [facility];  Service: Gynecology;  Laterality: N/A;  . CESAREAN SECTION     x2  . CHONDROPLASTY Right 04/19/2019   Procedure: CHONDROPLASTY;  Surgeon: Earlie Server, MD;  Location: [facility];  Service: Orthopedics;  Laterality: Right;  . COLONOSCOPY    . DILATION AND CURETTAGE OF UTERUS    . KNEE ARTHROSCOPY WITH MEDIAL MENISECTOMY Right 04/19/2019   Procedure: KNEE ARTHROSCOPY WITH MEDIAL MENISECTOMY;  Surgeon: Earlie Server, MD;  Location: [facility];  Service: Orthopedics;  Laterality: Right;  . SALPINGOOPHORECTOMY Bilateral 10/11/2016   Procedure: BILATERAL SALPINGO OOPHORECTOMY;  Surgeon: Linda Hedges, DO;  Location: [facility];  Service: Gynecology;  Laterality: Bilateral;  . TENDON TRANSFER  08/30/2012   Procedure: TENDON TRANSFER;  Surgeon: Roseanne Kaufman, MD;  Location: [facility];  Service: Orthopedics;  Laterality: Left;  LEFT THUMB  EXTENSOR INDICUS POLLICUS TO EXTENSOR POLLICUS LONGUS TENDON TRANSFER GENERAL WITH BLOCK  . WISDOM TOOTH EXTRACTION      There were no vitals filed for this visit.  Subjective Assessment - 05/15/19 1111    Subjective  No pain, just some tighness.    Currently in Pain?  No/denies         [facility] PT Assessment - 05/15/19 0001      AROM   Right Knee Flexion  135    Left Knee Extension  0      Strength   Right Hip ABduction  4+/5    Right Knee Flexion  5/5    Right Knee Extension  5/5                   [facility] Adult PT Treatment/Exercise - 05/15/19 0001      Knee/Hip Exercises: Stretches   Active Hamstring Stretch  3 reps;30 seconds    Active Hamstring Stretch Limitations  seated edge of mat and supine    Quad Stretch Limitations  prone with strap 3 x 30 sec       Knee/Hip Exercises: Standing   Lateral Step Up  15 reps;Step Height: 6"    Lateral Step Up Limitations  long pole for balance     SLS  60 sec        Knee/Hip Exercises: Seated   Sit to Sand  10  reps      Manual Therapy   Manual therapy comments  soft tissue work to distal quads, cross friction to scars and patella tendon, patella mobs all planes             PT Education - 05/15/19 1103    Education Details  HEP    Person(s) Educated  Patient    Methods  Explanation;Handout    Comprehension  Verbalized understanding       PT Short Term Goals - 05/15/19 1129      PT SHORT TERM GOAL #1   Title  Pt will be I with HEP for ROM and strength of Rt knee.    Time  4    Period  Weeks    Status  Achieved      PT SHORT TERM GOAL #2   Title  Pt will be able to return to work with min pain in Rt knee, min swelling.    Time  4    Period  Weeks    Status  Achieved      PT SHORT TERM GOAL #3   Title  Pt will be able to walk as needed in the community with pain < 5/10    Time  4    Period  Weeks    Status  On-going        PT Long Term Goals - 05/15/19 1130      PT LONG TERM GOAL #1    Title  Pt with score 32% or less on FOTO to show functional mobility improvements.    Time  8    Period  Weeks    Status  Unable to assess      PT LONG TERM GOAL #2   Title  Pt will be able to negotiate 17 steps without rails reciprocally and no increased pain    Baseline  doing multiple mes at home daily    Time  8    Period  Weeks    Status  Achieved      PT LONG TERM GOAL #3   Title  Pt will be able to squat without pain in knee.    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 She notes some hamstring tightness with work however no increased pain. Her AROM and strength have improved in knee and hip. SLS is 60 sec. Advanced HEP with closed chain. Some anterior knee tightness. Soft tissue, cross friction and patella mobs performed. She reports knee feeling much better after treatment.  She will work on closed chain at home and return in 2 weeks prior to her next MD follow up.    PT Next Visit Plan  check goals, is she back to walking, FOTO    PT Home Exercise Plan  Hamstring, quad set, SLR/VMO and hip abd, lateral step ups, SLS, prone quad stretch, Sit to stand       Patient will benefit from skilled therapeutic intervention in order to improve the following deficits and impairments:  Increased fascial restricitons, Decreased scar mobility, Decreased mobility, Decreased range of motion, Decreased strength, Hypomobility, Impaired flexibility, Increased edema, Difficulty walking  Visit Diagnosis: 1. Acute pain of right  knee   2. Stiffness of right knee, not elsewhere classified   3. Localized edema   4. Difficulty in walking, not elsewhere classified        Problem List Patient Active Problem List   Diagnosis Date Noted  . S/P abdominal hysterectomy 10/11/2016  . Multiple pulmonary nodules 05/22/2013  . DOE (dyspnea on exertion) 05/21/2013    Dorene Ar, PTA 05/15/2019, 11:32 AM  [facility] [address] [place], [place], [address] Phone: [phone]   Fax:  [phone]  Name: Whitney [name] MRN: [id number] Date of Birth: [birth year]

## 2019-05-29 ENCOUNTER — Encounter: Payer: Self-pay | Admitting: Physical Therapy

## 2019-05-29 ENCOUNTER — Ambulatory Visit: Payer: 59 | Admitting: Physical Therapy

## 2019-05-29 ENCOUNTER — Other Ambulatory Visit: Payer: Self-pay

## 2019-05-29 DIAGNOSIS — M25561 Pain in right knee: Secondary | ICD-10-CM | POA: Diagnosis not present

## 2019-05-29 DIAGNOSIS — E876 Hypokalemia: Secondary | ICD-10-CM | POA: Diagnosis not present

## 2019-05-29 DIAGNOSIS — R600 Localized edema: Secondary | ICD-10-CM | POA: Diagnosis not present

## 2019-05-29 DIAGNOSIS — R262 Difficulty in walking, not elsewhere classified: Secondary | ICD-10-CM | POA: Diagnosis not present

## 2019-05-29 DIAGNOSIS — I10 Essential (primary) hypertension: Secondary | ICD-10-CM | POA: Diagnosis not present

## 2019-05-29 DIAGNOSIS — M25661 Stiffness of right knee, not elsewhere classified: Secondary | ICD-10-CM

## 2019-05-29 NOTE — Therapy (Signed)
[place], [place], [address] Phone: [phone]   Fax:  [phone]  Physical Therapy Treatment  Patient Details  Name: Whitney [name] MRN: [id number] Date of Birth: Feb 26, 1956 Referring Provider (PT): Dr. French Ana    Encounter Date: 05/29/2019  PT End of Session - 05/29/19 1002    Visit Number  4    Number of Visits  16    Date for PT Re-Evaluation  06/19/19    PT Start Time  1000    PT Stop Time  1045    PT Time Calculation (min)  45 min       Past Medical History:  Diagnosis Date  . Arthritis    hands  . Basal cell carcinoma of face   . Depression   . GAD (generalized anxiety disorder)   . GERD (gastroesophageal reflux disease)   . Hypertension   . Insomnia   . Pneumonia 07/27/2016   Bacterial  . Sleep apnea    mild, no cpap    Past Surgical History:  Procedure Laterality Date  . ABDOMINAL HYSTERECTOMY N/A 10/11/2016   Procedure: HYSTERECTOMY TOTAL  ABDOMINAL;  Surgeon: Linda Hedges, DO;  Location: [facility];  Service: Gynecology;  Laterality: N/A;  . CESAREAN SECTION     x2  . CHONDROPLASTY Right 04/19/2019   Procedure: CHONDROPLASTY;  Surgeon: Earlie Server, MD;  Location: [place];  Service: Orthopedics;  Laterality: Right;  . COLONOSCOPY    . DILATION AND CURETTAGE OF UTERUS    . KNEE ARTHROSCOPY WITH MEDIAL MENISECTOMY Right 04/19/2019   Procedure: KNEE ARTHROSCOPY WITH MEDIAL MENISECTOMY;  Surgeon: Earlie Server, MD;  Location: [place];  Service: Orthopedics;  Laterality: Right;  . SALPINGOOPHORECTOMY Bilateral 10/11/2016   Procedure: BILATERAL SALPINGO OOPHORECTOMY;  Surgeon: Linda Hedges, DO;  Location: [facility];  Service: Gynecology;  Laterality: Bilateral;  . TENDON TRANSFER  08/30/2012   Procedure: TENDON TRANSFER;  Surgeon: Roseanne Kaufman, MD;  Location: [place];  Service: Orthopedics;  Laterality: Left;  LEFT THUMB  EXTENSOR INDICUS POLLICUS TO EXTENSOR POLLICUS LONGUS TENDON TRANSFER GENERAL WITH BLOCK  . WISDOM TOOTH EXTRACTION      There were no vitals filed for this visit.  Subjective Assessment - 05/29/19 1001    Subjective  Tightness, sore to touch.    Currently in Pain?  No/denies         [facility] PT Assessment - 05/29/19 0001      Observation/Other Assessments   Focus on Therapeutic Outcomes (FOTO)   28%      AROM   Right Knee Flexion  127   134 end of session   Left Knee Extension  0                   [facility] Adult PT Treatment/Exercise - 05/29/19 0001      Knee/Hip Exercises: Stretches   Active Hamstring Stretch  3 reps;30 seconds    Active Hamstring Stretch Limitations  seated edge of mat and supine    Quad Stretch Limitations  prone with strap 3 x 30 sec     Hip Flexor Stretch Limitations  edge of mat with knee flexion x 60 sec       Knee/Hip Exercises: Prone   Hamstring Curl  1 set;20 reps    Hamstring Curl Limitations  3#      Manual Therapy   Manual therapy comments  soft tissue work to distal quads, cross friction  to scars and patella tendon, patella mobs all planes, prone hamstring STW              PT Education - 05/29/19 1127    Education Details  HEP    Person(s) Educated  Patient    Methods  Explanation;Handout    Comprehension  Verbalized understanding       PT Short Term Goals - 05/29/19 1135      PT SHORT TERM GOAL #1   Title  Pt will be I with HEP for ROM and strength of Rt knee.    Time  4    Period  Weeks    Status  Achieved      PT SHORT TERM GOAL #2   Title  Pt will be able to return to work with min pain in Rt knee, min swelling.    Time  4    Period  Weeks    Status  Achieved      PT SHORT TERM GOAL #3   Title  Pt will be able to walk as needed in the community with pain < 5/10    Time  4    Period  Weeks    Status  Achieved        PT Long Term Goals - 05/29/19 1136      PT LONG TERM GOAL #1   Title  Pt with  score 32% or less on FOTO to show functional mobility improvements.    Baseline  29% status 05/29/2019    Time  8    Period  Weeks    Status  Achieved      PT LONG TERM GOAL #2   Title  Pt will be able to negotiate 17 steps without rails reciprocally and no increased pain    Baseline  doing multiple times at home daily    Time  8    Period  Weeks    Status  Achieved      PT LONG TERM GOAL #3   Title  Pt will be able to squat without pain in knee.    Time  8    Period  Weeks    Status  Unable to assess      PT LONG TERM GOAL #4   Title  Pt will return to walking for fitness, up to 2 miles with min discomfort, relieved by rest    Baseline  1 mile or more at home    Time  8    Period  Weeks    Status  Partially Met      PT LONG TERM GOAL #5   Title  AROM Rt knee flexion to 125 deg or more    Time  8    Period  Weeks    Status  Achieved            Plan - 05/29/19 1129    Clinical Impression Statement  Pt arrives reporting tightness in anterior and posterior knee as well as tenderness anterior medial knee. She is performing HEP more frequently than prescribed and could possibly be over doing it. Her flexion ROM was 127 at beginning of session. After manual and stretching ROM improved to 134 degrees flexion and she felt much better. Encouraged more frequent stretching and self massage/patella mobs. She has returned to walking at home 1 mile and more. Her FOTO is much improved and beyond predicted outcome. 3 out of 5 LTGS are met. SHe will follow up with MD  tomorrow. She has scheduled one more appointment here at PT in 2 weeks and may call to cancel if she does not feel she needs more PT.    PT Next Visit Plan  ERO vs discharge    PT Home Exercise Plan  Hamstring, quad set, SLR/VMO and hip abd, lateral step ups, SLS, prone quad stretch, Sit to stand, hip flexor stretch    Consulted and Agree with Plan of Care  Patient       Patient will benefit from skilled therapeutic  intervention in order to improve the following deficits and impairments:  Increased fascial restricitons, Decreased scar mobility, Decreased mobility, Decreased range of motion, Decreased strength, Hypomobility, Impaired flexibility, Increased edema, Difficulty walking  Visit Diagnosis: 1. Acute pain of right knee   2. Stiffness of right knee, not elsewhere classified   3. Localized edema   4. Difficulty in walking, not elsewhere classified        Problem List Patient Active Problem List   Diagnosis Date Noted  . S/P abdominal hysterectomy 10/11/2016  . Multiple pulmonary nodules 05/22/2013  . DOE (dyspnea on exertion) 05/21/2013    Dorene Ar, PTA 05/29/2019, 11:46 AM  [facility] [address] [place], [place], [address] Phone: [phone]   Fax:  [phone]  Name: Whitney [name] MRN: [id number] Date of Birth: August 06, 1956

## 2019-05-30 DIAGNOSIS — S83241D Other tear of medial meniscus, current injury, right knee, subsequent encounter: Secondary | ICD-10-CM | POA: Diagnosis not present

## 2019-06-14 ENCOUNTER — Other Ambulatory Visit: Payer: Self-pay

## 2019-06-14 ENCOUNTER — Ambulatory Visit: Payer: 59 | Attending: Orthopedic Surgery | Admitting: Physical Therapy

## 2019-06-14 DIAGNOSIS — M25561 Pain in right knee: Secondary | ICD-10-CM | POA: Diagnosis not present

## 2019-06-14 DIAGNOSIS — R600 Localized edema: Secondary | ICD-10-CM | POA: Diagnosis not present

## 2019-06-14 DIAGNOSIS — M25661 Stiffness of right knee, not elsewhere classified: Secondary | ICD-10-CM | POA: Diagnosis not present

## 2019-06-14 DIAGNOSIS — R262 Difficulty in walking, not elsewhere classified: Secondary | ICD-10-CM | POA: Insufficient documentation

## 2019-06-14 NOTE — Therapy (Signed)
[place], [place], [address] Phone: [phone]   Fax:  [phone]  Physical Therapy Treatment/Renewal  Patient Details  Name: Whitney [name] MRN: [id number] Date of Birth: 01-13-1956 Referring Provider (PT): Dr. French Ana    Encounter Date: 06/14/2019  PT End of Session - 06/14/19 1041    Visit Number  5    Number of Visits  11    Date for PT Re-Evaluation  07/26/19    PT Start Time  3005    PT Stop Time  1115    PT Time Calculation (min)  40 min    Activity Tolerance  Patient tolerated treatment well    Behavior During Therapy  [facility] for tasks assessed/performed       Past Medical History:  Diagnosis Date  . Arthritis    hands  . Basal cell carcinoma of face   . Depression   . GAD (generalized anxiety disorder)   . GERD (gastroesophageal reflux disease)   . Hypertension   . Insomnia   . Pneumonia 07/27/2016   Bacterial  . Sleep apnea    mild, no cpap    Past Surgical History:  Procedure Laterality Date  . ABDOMINAL HYSTERECTOMY N/A 10/11/2016   Procedure: HYSTERECTOMY TOTAL  ABDOMINAL;  Surgeon: Linda Hedges, DO;  Location: [facility];  Service: Gynecology;  Laterality: N/A;  . CESAREAN SECTION     x2  . CHONDROPLASTY Right 04/19/2019   Procedure: CHONDROPLASTY;  Surgeon: Earlie Server, MD;  Location: [place];  Service: Orthopedics;  Laterality: Right;  . COLONOSCOPY    . DILATION AND CURETTAGE OF UTERUS    . KNEE ARTHROSCOPY WITH MEDIAL MENISECTOMY Right 04/19/2019   Procedure: KNEE ARTHROSCOPY WITH MEDIAL MENISECTOMY;  Surgeon: Earlie Server, MD;  Location: [place];  Service: Orthopedics;  Laterality: Right;  . SALPINGOOPHORECTOMY Bilateral 10/11/2016   Procedure: BILATERAL SALPINGO OOPHORECTOMY;  Surgeon: Linda Hedges, DO;  Location: [facility];  Service: Gynecology;  Laterality: Bilateral;  . TENDON TRANSFER  08/30/2012   Procedure: TENDON TRANSFER;   Surgeon: Roseanne Kaufman, MD;  Location: [place];  Service: Orthopedics;  Laterality: Left;  LEFT THUMB EXTENSOR INDICUS POLLICUS TO EXTENSOR POLLICUS LONGUS TENDON TRANSFER GENERAL WITH BLOCK  . WISDOM TOOTH EXTRACTION      There were no vitals filed for this visit.  Subjective Assessment - 06/14/19 1038    Subjective  Tightness.  Dr. French Ana said take Meloxicam daily for a couple weeks to see how I do.         [facility] PT Assessment - 06/14/19 0001      AROM   Right Knee Flexion  128      Strength   Right Knee Flexion  5/5    Right Knee Extension  5/5        [facility] Adult PT Treatment/Exercise - 06/14/19 0001      Knee/Hip Exercises: Stretches   Active Hamstring Stretch  Right;3 reps;30 seconds    Quad Stretch Limitations  prone with strap 3 x 30 sec     Gastroc Stretch  Both;3 reps    Gastroc Stretch Limitations  slant board       Knee/Hip Exercises: Aerobic   Stationary Bike  L2 for 5 min       Knee/Hip Exercises: Standing   Hip Abduction  Stengthening;Both;1 set;10 reps    Abduction Limitations  no UE     Hip Extension  Stengthening;Both;1 set;10 reps  Extension Limitations  no UE     Forward Step Up  Right;1 set;10 reps;Hand Hold: 1;Step Height: 8"    Step Down  Right;1 set;10 reps;Hand Hold: 1;Step Height: 4"    Functional Squat  1 set;10 reps    Wall Squat  1 set;10 reps    SLS  30 sec no difficulty     Other Standing Knee Exercises  dead lift single leg with fingertips on countertop x 10 each , 10 lbs x 10        Knee/Hip Exercises: Supine   Straight Leg Raises  Strengthening;Right;1 set;15 reps               PT Short Term Goals - 06/14/19 1043      PT SHORT TERM GOAL #1   Title  Pt will be I with HEP for ROM and strength of Rt knee.    Status  Achieved      PT SHORT TERM GOAL #2   Title  Pt will be able to return to work with min pain in Rt knee, min swelling.    Status  Achieved      PT SHORT TERM GOAL #3   Title  Pt will  be able to walk as needed in the community with pain < 5/10    Status  Achieved        PT Long Term Goals - 06/14/19 1044      PT LONG TERM GOAL #1   Title  Pt with score 32% or less on FOTO to show functional mobility improvements.    Baseline  29% status 05/29/2019    Status  Achieved      PT LONG TERM GOAL #2   Title  Pt will be able to negotiate 17 steps without rails reciprocally and no increased pain    Status  Achieved      PT LONG TERM GOAL #3   Title  Pt will be able to squat without pain in knee.    Baseline  tightness posterior, no pain    Status  Partially Met      PT LONG TERM GOAL #4   Title  Pt will return to walking for fitness, up to 2 miles with min discomfort, relieved by rest    Baseline  1 mile    Status  Partially Met      PT LONG TERM GOAL #5   Title  AROM Rt knee flexion to 125 deg or more    Status  Achieved            Plan - 06/14/19 1047    Clinical Impression Statement  Patient has been exercising at home but cont to have stiffness in flexion and feeling like she has a "lump" behind her knee when she flexes into a squat.  ROM progressed from 120 to 128 during session. Good knee control with step ups and downs.  She will benefit from 4-6 more visits to challenge in standing, improve endurance and achieve greater ROM with more ease.    Examination-Activity Limitations  Squat;Stairs;Stand;Locomotion Level;Transfers;Sleep    Examination-Participation Restrictions  Yard Work;Community Activity    Stability/Clinical Decision Making  Stable/Uncomplicated    Rehab Potential  Excellent    PT Frequency  1x / week    PT Duration  6 weeks    PT Treatment/Interventions  ADLs/Self Care Home Management;Electrical Stimulation;Therapeutic activities;Patient/family education;Taping;Therapeutic exercise;Vasopneumatic Device;Passive range of motion;Neuromuscular re-education;Stair training;Moist Heat;Cryotherapy;Functional mobility training;Manual techniques  PT Next Visit Plan  cont with standing/CKC, try elliptical and or bike.  consider Reformer    PT Home Exercise Plan  Hamstring, quad set, SLR/VMO and hip abd, lateral step ups, SLS, prone quad stretch, Sit to stand, hip flexor stretch    Consulted and Agree with Plan of Care  Patient       Patient will benefit from skilled therapeutic intervention in order to improve the following deficits and impairments:  Increased fascial restricitons, Decreased scar mobility, Decreased mobility, Decreased range of motion, Decreased strength, Hypomobility, Impaired flexibility, Increased edema, Difficulty walking  Visit Diagnosis: 1. Acute pain of right knee   2. Stiffness of right knee, not elsewhere classified   3. Localized edema   4. Difficulty in walking, not elsewhere classified        Problem List Patient Active Problem List   Diagnosis Date Noted  . S/P abdominal hysterectomy 10/11/2016  . Multiple pulmonary nodules 05/22/2013  . DOE (dyspnea on exertion) 05/21/2013    Jadesola Poynter 06/14/2019, [place], [place], [address] Phone: [phone]   Fax:  [phone]  Name: Whitney [name] MRN: [id number] Date of Birth: 1956/07/26  Raeford Razor, PT 06/14/19 11:22 AM Phone: [phone] Fax: [phone]

## 2019-06-19 ENCOUNTER — Ambulatory Visit: Payer: 59 | Admitting: Physical Therapy

## 2019-06-21 ENCOUNTER — Encounter

## 2019-06-26 ENCOUNTER — Other Ambulatory Visit: Payer: Self-pay

## 2019-06-26 ENCOUNTER — Ambulatory Visit: Payer: 59 | Admitting: Physical Therapy

## 2019-06-26 ENCOUNTER — Encounter: Payer: Self-pay | Admitting: Physical Therapy

## 2019-06-26 DIAGNOSIS — M25561 Pain in right knee: Secondary | ICD-10-CM | POA: Diagnosis not present

## 2019-06-26 DIAGNOSIS — M25661 Stiffness of right knee, not elsewhere classified: Secondary | ICD-10-CM

## 2019-06-26 DIAGNOSIS — R600 Localized edema: Secondary | ICD-10-CM | POA: Diagnosis not present

## 2019-06-26 DIAGNOSIS — R262 Difficulty in walking, not elsewhere classified: Secondary | ICD-10-CM

## 2019-06-26 NOTE — Therapy (Addendum)
[place], [place], [address] Phone: [phone]   Fax:  [phone]  Physical Therapy Treatment/Discharge  Patient Details  Name: Whitney [name] MRN: [id number] Date of Birth: February 16, 1956 Referring Provider (PT): Dr. French Ana    Encounter Date: 06/26/2019  PT End of Session - 06/26/19 1150    Visit Number  6    Number of Visits  11    Date for PT Re-Evaluation  07/26/19    PT Start Time  9518    PT Stop Time  1225    PT Time Calculation (min)  40 min       Past Medical History:  Diagnosis Date  . Arthritis    hands  . Basal cell carcinoma of face   . Depression   . GAD (generalized anxiety disorder)   . GERD (gastroesophageal reflux disease)   . Hypertension   . Insomnia   . Pneumonia 07/27/2016   Bacterial  . Sleep apnea    mild, no cpap    Past Surgical History:  Procedure Laterality Date  . ABDOMINAL HYSTERECTOMY N/A 10/11/2016   Procedure: HYSTERECTOMY TOTAL  ABDOMINAL;  Surgeon: Linda Hedges, DO;  Location: [facility];  Service: Gynecology;  Laterality: N/A;  . CESAREAN SECTION     x2  . CHONDROPLASTY Right 04/19/2019   Procedure: CHONDROPLASTY;  Surgeon: Earlie Server, MD;  Location: [place];  Service: Orthopedics;  Laterality: Right;  . COLONOSCOPY    . DILATION AND CURETTAGE OF UTERUS    . KNEE ARTHROSCOPY WITH MEDIAL MENISECTOMY Right 04/19/2019   Procedure: KNEE ARTHROSCOPY WITH MEDIAL MENISECTOMY;  Surgeon: Earlie Server, MD;  Location: [place];  Service: Orthopedics;  Laterality: Right;  . SALPINGOOPHORECTOMY Bilateral 10/11/2016   Procedure: BILATERAL SALPINGO OOPHORECTOMY;  Surgeon: Linda Hedges, DO;  Location: [facility];  Service: Gynecology;  Laterality: Bilateral;  . TENDON TRANSFER  08/30/2012   Procedure: TENDON TRANSFER;  Surgeon: Roseanne Kaufman, MD;  Location: [place];  Service: Orthopedics;  Laterality: Left;  LEFT  THUMB EXTENSOR INDICUS POLLICUS TO EXTENSOR POLLICUS LONGUS TENDON TRANSFER GENERAL WITH BLOCK  . WISDOM TOOTH EXTRACTION      There were no vitals filed for this visit.  Subjective Assessment - 06/26/19 1151    Pertinent History  A little stiff.    Currently in Pain?  No/denies         [facility] PT Assessment - 06/26/19 0001      AROM   Right Knee Flexion  131                   [facility] Adult PT Treatment/Exercise - 06/26/19 0001      Knee/Hip Exercises: Stretches   Sports administrator Limitations  prone with strap 3 x 30 sec       Knee/Hip Exercises: Aerobic   Elliptical  L2 x 5 minutes       Knee/Hip Exercises: Standing   Wall Squat Limitations  3 reps 10 sec , began to have right knee pain     Other Standing Knee Exercises  dead lift single leg with fingertips on countertop  , 10 lbs x 15  right       Manual Therapy   Manual Therapy  Soft tissue mobilization    Manual therapy comments  patella mobs     Soft tissue mobilization  Sift tissue to quad, massage roller to hamstrings, cross friction to patella tendon  PT Short Term Goals - 06/14/19 1043      PT SHORT TERM GOAL #1   Title  Pt will be I with HEP for ROM and strength of Rt knee.    Status  Achieved      PT SHORT TERM GOAL #2   Title  Pt will be able to return to work with min pain in Rt knee, min swelling.    Status  Achieved      PT SHORT TERM GOAL #3   Title  Pt will be able to walk as needed in the community with pain < 5/10    Status  Achieved        PT Long Term Goals - 06/14/19 1044      PT LONG TERM GOAL #1   Title  Pt with score 32% or less on FOTO to show functional mobility improvements.    Baseline  29% status 05/29/2019    Status  Achieved      PT LONG TERM GOAL #2   Title  Pt will be able to negotiate 17 steps without rails reciprocally and no increased pain    Status  Achieved      PT LONG TERM GOAL #3   Title  Pt will be able to squat without pain in knee.     Baseline  tightness posterior, no pain    Status  Partially Met      PT LONG TERM GOAL #4   Title  Pt will return to walking for fitness, up to 2 miles with min discomfort, relieved by rest    Baseline  1 mile    Status  Partially Met      PT LONG TERM GOAL #5   Title  AROM Rt knee flexion to 125 deg or more    Status  Achieved            Plan - 06/26/19 1151    Clinical Impression Statement  ROM 131 After warm up which is the best she has measured at start of session. Still reports tightness and feeling of knot behind knee most bothersome. Focused manual for patella mobs , quad and hamstring soft tissue lengthening and quad stretching. AROM improved from 131 to 139 degrees flexion post session. Encouraged her to focus stetching to see if she can maintain ROM. She is on the wait list for the next two weeks.    PT Next Visit Plan  cont with standing/CKC, try elliptical and or bike.  consider Reformer, focus stretching and manual to maximize ROM    PT Home Exercise Plan  Hamstring, quad set, SLR/VMO and hip abd, lateral step ups, SLS, prone quad stretch, Sit to stand, hip flexor stretch       Patient will benefit from skilled therapeutic intervention in order to improve the following deficits and impairments:  Increased fascial restricitons, Decreased scar mobility, Decreased mobility, Decreased range of motion, Decreased strength, Hypomobility, Impaired flexibility, Increased edema, Difficulty walking  Visit Diagnosis: Acute pain of right knee  Stiffness of right knee, not elsewhere classified  Localized edema  Difficulty in walking, not elsewhere classified     Problem List Patient Active Problem List   Diagnosis Date Noted  . S/P abdominal hysterectomy 10/11/2016  . Multiple pulmonary nodules 05/22/2013  . DOE (dyspnea on exertion) 05/21/2013    [place], Jessica McGee, PTA 06/26/2019, 1:49 PM  [facility] [address] [place], [place], [address] Phone: [phone]     Fax:  [phone]  Name: Whitney [name] MRN: [id number] Date of Birth: 05/21/1956  PHYSICAL THERAPY DISCHARGE SUMMARY  Visits from Start of Care: 6  Current functional level related to goals / functional outcomes: See above for most recent    Remaining deficits: See above, had some tightness and pain with squatting    Education / Equipment: HEP, RICE  Plan: Patient agrees to discharge.  Patient goals were partially met. Patient is being discharged due to not returning since the last visit.  ?????    Jennifer Paa, PT 09/11/19 10:40 AM Phone: [phone] Fax: [phone]  

## 2019-07-17 DIAGNOSIS — Z85828 Personal history of other malignant neoplasm of skin: Secondary | ICD-10-CM | POA: Diagnosis not present

## 2019-07-17 DIAGNOSIS — L819 Disorder of pigmentation, unspecified: Secondary | ICD-10-CM | POA: Diagnosis not present

## 2019-07-17 DIAGNOSIS — L821 Other seborrheic keratosis: Secondary | ICD-10-CM | POA: Diagnosis not present

## 2019-07-17 DIAGNOSIS — D1801 Hemangioma of skin and subcutaneous tissue: Secondary | ICD-10-CM | POA: Diagnosis not present

## 2019-07-17 DIAGNOSIS — D2272 Melanocytic nevi of left lower limb, including hip: Secondary | ICD-10-CM | POA: Diagnosis not present

## 2019-07-17 DIAGNOSIS — L720 Epidermal cyst: Secondary | ICD-10-CM | POA: Diagnosis not present

## 2019-07-17 DIAGNOSIS — L814 Other melanin hyperpigmentation: Secondary | ICD-10-CM | POA: Diagnosis not present

## 2019-07-17 DIAGNOSIS — D225 Melanocytic nevi of trunk: Secondary | ICD-10-CM | POA: Diagnosis not present

## 2019-07-18 ENCOUNTER — Ambulatory Visit: Payer: 59 | Admitting: Physical Therapy

## 2019-09-05 DIAGNOSIS — M25511 Pain in right shoulder: Secondary | ICD-10-CM | POA: Diagnosis not present

## 2019-09-05 DIAGNOSIS — M542 Cervicalgia: Secondary | ICD-10-CM | POA: Diagnosis not present

## 2019-09-24 DIAGNOSIS — M5412 Radiculopathy, cervical region: Secondary | ICD-10-CM | POA: Diagnosis not present

## 2019-10-09 ENCOUNTER — Other Ambulatory Visit: Payer: Self-pay | Admitting: Neurological Surgery

## 2019-10-09 DIAGNOSIS — M5412 Radiculopathy, cervical region: Secondary | ICD-10-CM

## 2019-10-26 ENCOUNTER — Ambulatory Visit
Admission: RE | Admit: 2019-10-26 | Discharge: 2019-10-26 | Disposition: A | Discharge status: Home / Self Care | Payer: 59 | Source: Ambulatory Visit | Attending: Neurological Surgery | Admitting: Neurological Surgery

## 2019-10-26 ENCOUNTER — Other Ambulatory Visit: Payer: Self-pay

## 2019-10-26 DIAGNOSIS — M47812 Spondylosis without myelopathy or radiculopathy, cervical region: Secondary | ICD-10-CM | POA: Diagnosis not present

## 2019-10-26 DIAGNOSIS — M5412 Radiculopathy, cervical region: Secondary | ICD-10-CM

## 2019-10-26 DIAGNOSIS — M4802 Spinal stenosis, cervical region: Secondary | ICD-10-CM | POA: Diagnosis not present

## 2019-11-07 DIAGNOSIS — E876 Hypokalemia: Secondary | ICD-10-CM | POA: Diagnosis not present

## 2019-11-07 DIAGNOSIS — F411 Generalized anxiety disorder: Secondary | ICD-10-CM | POA: Diagnosis not present

## 2019-11-07 DIAGNOSIS — R7301 Impaired fasting glucose: Secondary | ICD-10-CM | POA: Diagnosis not present

## 2019-11-07 DIAGNOSIS — Z79899 Other long term (current) drug therapy: Secondary | ICD-10-CM | POA: Diagnosis not present

## 2019-11-07 DIAGNOSIS — I10 Essential (primary) hypertension: Secondary | ICD-10-CM | POA: Diagnosis not present

## 2019-11-07 DIAGNOSIS — K219 Gastro-esophageal reflux disease without esophagitis: Secondary | ICD-10-CM | POA: Diagnosis not present

## 2019-11-07 DIAGNOSIS — E782 Mixed hyperlipidemia: Secondary | ICD-10-CM | POA: Diagnosis not present

## 2019-11-07 DIAGNOSIS — M5412 Radiculopathy, cervical region: Secondary | ICD-10-CM | POA: Diagnosis not present

## 2019-11-07 DIAGNOSIS — E663 Overweight: Secondary | ICD-10-CM | POA: Diagnosis not present

## 2019-11-07 DIAGNOSIS — R7303 Prediabetes: Secondary | ICD-10-CM | POA: Diagnosis not present

## 2019-11-11 ENCOUNTER — Other Ambulatory Visit: Payer: Self-pay | Admitting: Student

## 2019-11-11 DIAGNOSIS — M5412 Radiculopathy, cervical region: Secondary | ICD-10-CM

## 2019-11-13 ENCOUNTER — Ambulatory Visit
Admission: RE | Admit: 2019-11-13 | Discharge: 2019-11-13 | Disposition: A | Discharge status: Home / Self Care | Payer: 59 | Source: Ambulatory Visit | Attending: Student | Admitting: Student

## 2019-11-13 ENCOUNTER — Other Ambulatory Visit: Payer: Self-pay

## 2019-11-13 DIAGNOSIS — M542 Cervicalgia: Secondary | ICD-10-CM | POA: Diagnosis not present

## 2019-11-13 DIAGNOSIS — M5412 Radiculopathy, cervical region: Secondary | ICD-10-CM

## 2019-11-13 MED ORDER — TRIAMCINOLONE ACETONIDE 40 MG/ML IJ SUSP (RADIOLOGY)
60.0000 mg | Freq: Once | INTRAMUSCULAR | Status: AC
  Administered 2019-11-13: 60 mg via EPIDURAL

## 2019-11-13 MED ORDER — IOPAMIDOL (ISOVUE-M 300) INJECTION 61%
1.0000 mL | Freq: Once | INTRAMUSCULAR | Status: AC | PRN
  Administered 2019-11-13: 1 mL via EPIDURAL

## 2019-11-13 NOTE — Discharge Instructions (Signed)

## 2020-01-22 ENCOUNTER — Other Ambulatory Visit (HOSPITAL_COMMUNITY): Payer: Self-pay | Admitting: Radiology

## 2020-01-22 DIAGNOSIS — Z1231 Encounter for screening mammogram for malignant neoplasm of breast: Secondary | ICD-10-CM | POA: Diagnosis not present

## 2020-01-22 DIAGNOSIS — Z01419 Encounter for gynecological examination (general) (routine) without abnormal findings: Secondary | ICD-10-CM | POA: Diagnosis not present

## 2020-01-22 DIAGNOSIS — Z6829 Body mass index (BMI) 29.0-29.9, adult: Secondary | ICD-10-CM | POA: Diagnosis not present

## 2020-05-07 DIAGNOSIS — R7309 Other abnormal glucose: Secondary | ICD-10-CM | POA: Diagnosis not present

## 2020-05-07 DIAGNOSIS — I10 Essential (primary) hypertension: Secondary | ICD-10-CM | POA: Diagnosis not present

## 2020-05-07 DIAGNOSIS — E782 Mixed hyperlipidemia: Secondary | ICD-10-CM | POA: Diagnosis not present

## 2020-05-07 DIAGNOSIS — Z79899 Other long term (current) drug therapy: Secondary | ICD-10-CM | POA: Diagnosis not present

## 2020-05-14 ENCOUNTER — Other Ambulatory Visit (HOSPITAL_COMMUNITY): Payer: Self-pay | Admitting: Family Medicine

## 2020-05-14 DIAGNOSIS — F411 Generalized anxiety disorder: Secondary | ICD-10-CM | POA: Diagnosis not present

## 2020-05-14 DIAGNOSIS — G4701 Insomnia due to medical condition: Secondary | ICD-10-CM | POA: Diagnosis not present

## 2020-05-14 DIAGNOSIS — E782 Mixed hyperlipidemia: Secondary | ICD-10-CM | POA: Diagnosis not present

## 2020-05-14 DIAGNOSIS — Z79899 Other long term (current) drug therapy: Secondary | ICD-10-CM | POA: Diagnosis not present

## 2020-05-14 DIAGNOSIS — R7309 Other abnormal glucose: Secondary | ICD-10-CM | POA: Diagnosis not present

## 2020-05-14 DIAGNOSIS — I10 Essential (primary) hypertension: Secondary | ICD-10-CM | POA: Diagnosis not present

## 2020-05-14 DIAGNOSIS — R232 Flushing: Secondary | ICD-10-CM | POA: Diagnosis not present

## 2020-05-14 DIAGNOSIS — K219 Gastro-esophageal reflux disease without esophagitis: Secondary | ICD-10-CM | POA: Diagnosis not present

## 2020-07-31 ENCOUNTER — Other Ambulatory Visit (HOSPITAL_COMMUNITY): Payer: Self-pay | Admitting: Family Medicine

## 2020-08-05 DIAGNOSIS — L568 Other specified acute skin changes due to ultraviolet radiation: Secondary | ICD-10-CM | POA: Diagnosis not present

## 2020-08-05 DIAGNOSIS — Z85828 Personal history of other malignant neoplasm of skin: Secondary | ICD-10-CM | POA: Diagnosis not present

## 2020-08-05 DIAGNOSIS — L821 Other seborrheic keratosis: Secondary | ICD-10-CM | POA: Diagnosis not present

## 2020-08-05 DIAGNOSIS — D239 Other benign neoplasm of skin, unspecified: Secondary | ICD-10-CM | POA: Diagnosis not present

## 2020-08-05 DIAGNOSIS — D229 Melanocytic nevi, unspecified: Secondary | ICD-10-CM | POA: Diagnosis not present

## 2020-08-09 DIAGNOSIS — L568 Other specified acute skin changes due to ultraviolet radiation: Secondary | ICD-10-CM | POA: Diagnosis not present

## 2020-08-09 DIAGNOSIS — D239 Other benign neoplasm of skin, unspecified: Secondary | ICD-10-CM | POA: Diagnosis not present

## 2020-08-09 DIAGNOSIS — D229 Melanocytic nevi, unspecified: Secondary | ICD-10-CM | POA: Diagnosis not present

## 2020-08-09 DIAGNOSIS — L821 Other seborrheic keratosis: Secondary | ICD-10-CM | POA: Diagnosis not present

## 2020-08-09 DIAGNOSIS — Z85828 Personal history of other malignant neoplasm of skin: Secondary | ICD-10-CM | POA: Diagnosis not present

## 2020-08-11 ENCOUNTER — Other Ambulatory Visit (HOSPITAL_COMMUNITY): Payer: Self-pay | Admitting: Family Medicine

## 2020-08-11 DIAGNOSIS — I10 Essential (primary) hypertension: Secondary | ICD-10-CM | POA: Diagnosis not present

## 2020-08-11 DIAGNOSIS — G4730 Sleep apnea, unspecified: Secondary | ICD-10-CM | POA: Diagnosis not present

## 2020-08-11 DIAGNOSIS — Z79899 Other long term (current) drug therapy: Secondary | ICD-10-CM | POA: Diagnosis not present

## 2020-09-09 ENCOUNTER — Other Ambulatory Visit (HOSPITAL_COMMUNITY): Payer: Self-pay | Admitting: Family Medicine

## 2020-09-09 DIAGNOSIS — E876 Hypokalemia: Secondary | ICD-10-CM | POA: Diagnosis not present

## 2020-09-09 DIAGNOSIS — Z79899 Other long term (current) drug therapy: Secondary | ICD-10-CM | POA: Diagnosis not present

## 2020-09-09 DIAGNOSIS — I10 Essential (primary) hypertension: Secondary | ICD-10-CM | POA: Diagnosis not present

## 2020-09-09 DIAGNOSIS — G4730 Sleep apnea, unspecified: Secondary | ICD-10-CM | POA: Diagnosis not present

## 2020-11-03 NOTE — Progress Notes (Signed)
11/04/20- 4 yoF never smoker for sleep evaluation. Married Primary school teacher. Referral courtesy of Whitney Nan, MD. Medical problem list includes hx DOE, benign lung nodules w/u 2014, Anxiety, GERD, HTN, Insomnia,  NPSG 08/14/14- AHI 4.8/ hr, desaturation to 85%, mild PLMS, body weight 175 lbs Epworth score-  Body weight today- 181 lbs Covid vax- 3 Phizer Flu vax- had -----Patient is here for snoring and states that she can hear herself breath out and that her husband with wake her and tell her to breath. Does not gasp for air. Wakes up 5-6 times a night.  Never feels rested. Bedtime 8:30-9:00 PM, WASO 5-6, up 6:30-7AM. No sleep meds, 1 cup AM coffee.  Denies ENT surgery, heart or lung disease, neurologic or thyroid problems.. No significant parasomnias.   Prior to Admission medications   Medication Sig Start Date End Date Taking? Authorizing Provider  amLODipine (NORVASC) 5 MG tablet Take 5 mg by mouth at bedtime.   Yes [provider]  chlorthalidone (HYGROTON) 25 MG tablet Take 25 mg by mouth daily.   Yes [provider]  citalopram (CELEXA) 20 MG tablet Take 10 mg by mouth at bedtime. Take 1/2 tablet daily at bedtime.   Yes [provider]  famotidine (PEPCID) 20 MG tablet TAKE 1 TABLET BY MOUTH ONCE DAILY AT BEDTIME 09/02/14  Yes Tanda Rockers, MD  metoprolol succinate (TOPROL-XL) 100 MG 24 hr tablet One half in am and one half pm 05/21/13  Yes Tanda Rockers, MD  pantoprazole (PROTONIX) 40 MG tablet Take 1 tablet (40 mg total) by mouth daily. Take 30-60 min before first meal of the day 09/17/14  Yes Tanda Rockers, MD  potassium chloride (KLOR-CON) 20 MEQ packet Take 20 mEq by mouth 2 (two) times daily.   Yes [provider]  promethazine (PHENERGAN) 12.5 MG tablet Take 1 tablet (12.5 mg total) by mouth every 6 (six) hours as needed for nausea or vomiting. 04/19/19  Yes Shepperson, Kirstin, PA-C   Past Medical History:  Diagnosis Date   Arthritis     hands   Basal cell carcinoma of face    Depression    GAD (generalized anxiety disorder)    GERD (gastroesophageal reflux disease)    Hypertension    Insomnia    Pneumonia 07/27/2016   Bacterial   Sleep apnea    mild, no cpap   Past Surgical History:  Procedure Laterality Date   ABDOMINAL HYSTERECTOMY N/A 10/11/2016   Procedure: HYSTERECTOMY TOTAL  ABDOMINAL;  Surgeon: Linda Hedges, DO;  Location: [facility];  Service: Gynecology;  Laterality: N/A;   CESAREAN SECTION     x2   CHONDROPLASTY Right 04/19/2019   Procedure: CHONDROPLASTY;  Surgeon: Earlie Server, MD;  Location: [place];  Service: Orthopedics;  Laterality: Right;   COLONOSCOPY     DILATION AND CURETTAGE OF UTERUS     KNEE ARTHROSCOPY WITH MEDIAL MENISECTOMY Right 04/19/2019   Procedure: KNEE ARTHROSCOPY WITH MEDIAL MENISECTOMY;  Surgeon: Earlie Server, MD;  Location: [place];  Service: Orthopedics;  Laterality: Right;   SALPINGOOPHORECTOMY Bilateral 10/11/2016   Procedure: BILATERAL SALPINGO OOPHORECTOMY;  Surgeon: Linda Hedges, DO;  Location: [facility];  Service: Gynecology;  Laterality: Bilateral;   TENDON TRANSFER  08/30/2012   Procedure: TENDON TRANSFER;  Surgeon: Roseanne Kaufman, MD;  Location: [facility];  Service: Orthopedics;  Laterality: Left;  LEFT THUMB EXTENSOR INDICUS POLLICUS TO EXTENSOR POLLICUS LONGUS TENDON TRANSFER GENERAL WITH BLOCK   WISDOM TOOTH  EXTRACTION     Family History  Problem Relation Age of Onset   Lung cancer Father        smoker   Social History   Socioeconomic History   Marital status: Married    Spouse name: Not on file   Number of children: Not on file   Years of education: Not on file   Highest education level: Not on file  Occupational History   Occupation: SECRETARY     Employer: [employer]    Comment: Retired Therapist, occupational  Tobacco Use   Smoking status: Never Smoker   Smokeless tobacco: Never  Used  Scientific laboratory technician Use: Never used  Substance and Sexual Activity   Alcohol use: Not Currently    Comment: occ   Drug use: No   Sexual activity: Not on file  Other Topics Concern   Not on file  Social History Narrative   Not on file   Social Determinants of Health   Financial Resource Strain: Not on file  Food Insecurity: Not on file  Transportation Needs: Not on file  Physical Activity: Not on file  Stress: Not on file  Social Connections: Not on file  Intimate Partner Violence: Not on file   ROS-see HPI  + = positive Constitutional:    weight loss, night sweats, fevers, chills,+ fatigue, lassitude. HEENT:    headaches, difficulty swallowing, tooth/dental problems, sore throat,       sneezing, itching, ear ache, nasal congestion, post nasal drip, snoring CV:    chest pain, orthopnea, PND, swelling in lower extremities, anasarca,                                  dizziness, palpitations Resp:   shortness of breath with exertion or at rest.                productive cough,   non-productive cough, coughing up of blood.              change in color of mucus.  wheezing.   Skin:    rash or lesions. GI:  No-   heartburn, indigestion, abdominal pain, nausea, vomiting, diarrhea,                 change in bowel habits, loss of appetite GU: dysuria, change in color of urine, no urgency or frequency.   flank pain. MS:   joint pain, stiffness, decreased range of motion, back pain. Neuro-     nothing unusual Psych:  change in mood or affect.  depression or anxiety.   memory loss.  OBJ- Physical Exam General- Alert, Oriented, Affect-appropriate, Distress- none acute Skin- rash-none, lesions- none, excoriation- none Lymphadenopathy- none Head- atraumatic            Eyes- Gross vision intact, PERRLA, conjunctivae and secretions clear            Ears- Hearing, canals-normal            Nose- Clear, no-Septal dev, mucus, polyps, erosion, perforation             Throat-  Mallampati IV , mucosa clear , drainage- none, tonsils- atrophic, + teeth Neck- flexible , trachea midline, no stridor , thyroid nl, carotid no bruit Chest - symmetrical excursion , unlabored           Heart/CV- RRR , no murmur , no gallop  , no rub, nl s1  s2                           - JVD- none , edema- none, stasis changes- none, varices- none           Lung- clear to P&A, wheeze- none, cough- none , dullness-none, rub- none           Chest wall-  Abd-  Br/ Gen/ Rectal- Not done, not indicated Extrem- cyanosis- none, clubbing, none, atrophy- none, strength- nl Neuro- grossly intact to observation

## 2020-11-04 ENCOUNTER — Ambulatory Visit (INDEPENDENT_AMBULATORY_CARE_PROVIDER_SITE_OTHER): Payer: 59 | Admitting: Internal Medicine

## 2020-11-04 ENCOUNTER — Other Ambulatory Visit: Payer: Self-pay

## 2020-11-04 ENCOUNTER — Encounter: Payer: Self-pay | Admitting: Internal Medicine

## 2020-11-04 VITALS — BP 140/88 | HR 94 | Temp 97.4°F | Ht 66.5 in | Wt 181.6 lb

## 2020-11-04 DIAGNOSIS — F5101 Primary insomnia: Secondary | ICD-10-CM

## 2020-11-04 DIAGNOSIS — G4733 Obstructive sleep apnea (adult) (pediatric): Secondary | ICD-10-CM | POA: Diagnosis not present

## 2020-11-04 DIAGNOSIS — G4730 Sleep apnea, unspecified: Secondary | ICD-10-CM | POA: Insufficient documentation

## 2020-11-04 DIAGNOSIS — G4700 Insomnia, unspecified: Secondary | ICD-10-CM | POA: Insufficient documentation

## 2020-11-04 NOTE — Patient Instructions (Signed)
Order- home sleep test   Dx OSA  Please cal [place] about 2 weeks after your sleep test for results and recommendations. If appropriate, we may be ale to start treatment before we see you next.

## 2020-11-04 NOTE — Assessment & Plan Note (Signed)
Probable OSA now, noting some weight gain since original dx. We reviewed physiology, medical concerns, responsible driving, testing and treatment options. Plan- HST then likely CPAP trial

## 2020-11-04 NOTE — Assessment & Plan Note (Signed)
Waking several times during night. It will be easier to assess this after we see how work-up for sleep disordered breathing goes.

## 2020-11-08 DIAGNOSIS — Z1152 Encounter for screening for COVID-19: Secondary | ICD-10-CM | POA: Diagnosis not present

## 2020-11-10 ENCOUNTER — Other Ambulatory Visit (HOSPITAL_COMMUNITY): Payer: Self-pay | Admitting: Student

## 2020-11-10 DIAGNOSIS — M5412 Radiculopathy, cervical region: Secondary | ICD-10-CM | POA: Diagnosis not present

## 2020-11-11 ENCOUNTER — Other Ambulatory Visit: Payer: Self-pay | Admitting: Student

## 2020-11-11 DIAGNOSIS — M5412 Radiculopathy, cervical region: Secondary | ICD-10-CM

## 2020-11-12 ENCOUNTER — Other Ambulatory Visit (HOSPITAL_COMMUNITY): Payer: Self-pay | Admitting: Student

## 2020-11-23 ENCOUNTER — Other Ambulatory Visit (HOSPITAL_COMMUNITY): Payer: Self-pay | Admitting: Family Medicine

## 2020-11-26 ENCOUNTER — Ambulatory Visit
Admission: RE | Admit: 2020-11-26 | Discharge: 2020-11-26 | Disposition: A | Discharge status: Home / Self Care | Payer: 59 | Source: Ambulatory Visit | Attending: Student | Admitting: Student

## 2020-11-26 ENCOUNTER — Other Ambulatory Visit: Payer: Self-pay

## 2020-11-26 DIAGNOSIS — M47812 Spondylosis without myelopathy or radiculopathy, cervical region: Secondary | ICD-10-CM | POA: Diagnosis not present

## 2020-11-26 DIAGNOSIS — M5412 Radiculopathy, cervical region: Secondary | ICD-10-CM

## 2020-11-26 MED ORDER — TRIAMCINOLONE ACETONIDE 40 MG/ML IJ SUSP (RADIOLOGY)
60.0000 mg | Freq: Once | INTRAMUSCULAR | Status: AC
  Administered 2020-11-26: 60 mg via EPIDURAL

## 2020-11-26 MED ORDER — IOPAMIDOL (ISOVUE-M 300) INJECTION 61%
1.0000 mL | Freq: Once | INTRAMUSCULAR | Status: AC
  Administered 2020-11-26: 1 mL via EPIDURAL

## 2020-11-26 NOTE — Discharge Instructions (Signed)

## 2020-12-01 ENCOUNTER — Other Ambulatory Visit (HOSPITAL_COMMUNITY): Payer: Self-pay | Admitting: Family Medicine

## 2020-12-02 ENCOUNTER — Other Ambulatory Visit: Payer: Self-pay

## 2020-12-02 ENCOUNTER — Ambulatory Visit: Payer: 59

## 2020-12-02 DIAGNOSIS — G4733 Obstructive sleep apnea (adult) (pediatric): Secondary | ICD-10-CM | POA: Diagnosis not present

## 2020-12-03 DIAGNOSIS — G4733 Obstructive sleep apnea (adult) (pediatric): Secondary | ICD-10-CM | POA: Diagnosis not present

## 2020-12-14 ENCOUNTER — Other Ambulatory Visit (HOSPITAL_COMMUNITY): Payer: Self-pay | Admitting: Family Medicine

## 2020-12-25 ENCOUNTER — Other Ambulatory Visit (HOSPITAL_COMMUNITY): Payer: Self-pay | Admitting: Family Medicine

## 2020-12-25 DIAGNOSIS — Z8701 Personal history of pneumonia (recurrent): Secondary | ICD-10-CM | POA: Diagnosis not present

## 2020-12-25 DIAGNOSIS — J988 Other specified respiratory disorders: Secondary | ICD-10-CM | POA: Diagnosis not present

## 2020-12-25 DIAGNOSIS — R059 Cough, unspecified: Secondary | ICD-10-CM | POA: Diagnosis not present

## 2020-12-28 ENCOUNTER — Telehealth: Payer: Self-pay | Admitting: Internal Medicine

## 2020-12-28 DIAGNOSIS — G4733 Obstructive sleep apnea (adult) (pediatric): Secondary | ICD-10-CM

## 2020-12-28 NOTE — Telephone Encounter (Signed)
Her sleep study showed mild to moderate sleep apnea averaging 15 apneas/ hour with drops in blood oxygen level  I recommend we order new DDME, new  CPAP auto 5-15, mask of choice, humidifier, suppliers, AirView/ card  Please make sure she has a return ov in 31-90 days  after getting machine, per insurance regs

## 2020-12-28 NOTE — Telephone Encounter (Signed)
Dr Annamaria Boots, please advise on HST results, thanks!

## 2020-12-29 NOTE — Telephone Encounter (Signed)
We should push that appointment out. Is there a way to create a tickle file to remind [place] to check on her and others on whom we are awaiting machines, so they don't get forgotten?

## 2020-12-29 NOTE — Telephone Encounter (Signed)
Called and spoke with patient about sleep study results and Dr. Janee Morn recommendations. She would like to proceed with CPAP therapy. Order has been placed. Advised patient to call back once she physically has machine to set up follow up appointment 31-90 days after getting machine.   Dr. Annamaria Boots she is scheduled for appointment in April would you like her to keep this appointment or push it out while she waits on machine?

## 2020-12-29 NOTE — Telephone Encounter (Signed)
Called and spoke with patient to let her know we are cancelling her appointment in April and for her to call us once she gets her CPAP machine. I have also set reminder to check on patient in a couple months for update. Nothing further needed at this time.

## 2021-01-01 ENCOUNTER — Other Ambulatory Visit (HOSPITAL_COMMUNITY): Payer: Self-pay | Admitting: Family Medicine

## 2021-01-01 DIAGNOSIS — G4701 Insomnia due to medical condition: Secondary | ICD-10-CM | POA: Diagnosis not present

## 2021-01-01 DIAGNOSIS — R7301 Impaired fasting glucose: Secondary | ICD-10-CM | POA: Diagnosis not present

## 2021-01-01 DIAGNOSIS — I10 Essential (primary) hypertension: Secondary | ICD-10-CM | POA: Diagnosis not present

## 2021-01-01 DIAGNOSIS — G4730 Sleep apnea, unspecified: Secondary | ICD-10-CM | POA: Diagnosis not present

## 2021-01-01 DIAGNOSIS — K219 Gastro-esophageal reflux disease without esophagitis: Secondary | ICD-10-CM | POA: Diagnosis not present

## 2021-01-01 DIAGNOSIS — E876 Hypokalemia: Secondary | ICD-10-CM | POA: Diagnosis not present

## 2021-01-01 DIAGNOSIS — E7800 Pure hypercholesterolemia, unspecified: Secondary | ICD-10-CM | POA: Diagnosis not present

## 2021-01-01 DIAGNOSIS — F411 Generalized anxiety disorder: Secondary | ICD-10-CM | POA: Diagnosis not present

## 2021-01-01 DIAGNOSIS — Z79899 Other long term (current) drug therapy: Secondary | ICD-10-CM | POA: Diagnosis not present

## 2021-01-11 ENCOUNTER — Other Ambulatory Visit (HOSPITAL_COMMUNITY): Payer: Self-pay | Admitting: Radiology

## 2021-01-22 ENCOUNTER — Other Ambulatory Visit (HOSPITAL_COMMUNITY): Payer: Self-pay | Admitting: Radiology

## 2021-01-22 DIAGNOSIS — Z13820 Encounter for screening for osteoporosis: Secondary | ICD-10-CM | POA: Diagnosis not present

## 2021-01-22 DIAGNOSIS — Z01419 Encounter for gynecological examination (general) (routine) without abnormal findings: Secondary | ICD-10-CM | POA: Diagnosis not present

## 2021-01-22 DIAGNOSIS — Z1231 Encounter for screening mammogram for malignant neoplasm of breast: Secondary | ICD-10-CM | POA: Diagnosis not present

## 2021-01-22 DIAGNOSIS — Z6829 Body mass index (BMI) 29.0-29.9, adult: Secondary | ICD-10-CM | POA: Diagnosis not present

## 2021-01-22 DIAGNOSIS — Z803 Family history of malignant neoplasm of breast: Secondary | ICD-10-CM | POA: Diagnosis not present

## 2021-01-22 DIAGNOSIS — Z801 Family history of malignant neoplasm of trachea, bronchus and lung: Secondary | ICD-10-CM | POA: Diagnosis not present

## 2021-02-03 ENCOUNTER — Ambulatory Visit: Payer: 59 | Admitting: Internal Medicine

## 2021-02-03 DIAGNOSIS — G4733 Obstructive sleep apnea (adult) (pediatric): Secondary | ICD-10-CM | POA: Diagnosis not present

## 2021-02-07 ENCOUNTER — Other Ambulatory Visit (HOSPITAL_COMMUNITY): Payer: Self-pay

## 2021-02-08 ENCOUNTER — Other Ambulatory Visit (HOSPITAL_COMMUNITY): Payer: Self-pay

## 2021-02-08 MED ORDER — POTASSIUM CHLORIDE CRYS ER 20 MEQ PO TBCR
20.0000 meq | EXTENDED_RELEASE_TABLET | Freq: Every day | ORAL | 1 refills | Status: DC
  Filled 2021-02-08: qty 90, 90d supply, fill #0

## 2021-02-08 MED ORDER — METFORMIN HCL 500 MG PO TABS
500.0000 mg | ORAL_TABLET | Freq: Every day | ORAL | 1 refills | Status: DC
  Filled 2021-02-08: qty 90, 90d supply, fill #0
  Filled 2021-05-14: qty 90, 90d supply, fill #1

## 2021-02-11 ENCOUNTER — Other Ambulatory Visit (HOSPITAL_COMMUNITY): Payer: Self-pay

## 2021-02-11 MED ORDER — VENLAFAXINE HCL ER 37.5 MG PO CP24
37.5000 mg | ORAL_CAPSULE | Freq: Every day | ORAL | 4 refills | Status: AC
  Filled 2021-02-11: qty 90, 90d supply, fill #0
  Filled 2021-05-14: qty 90, 90d supply, fill #1
  Filled 2021-08-16: qty 90, 90d supply, fill #2
  Filled 2021-11-12: qty 90, 90d supply, fill #3

## 2021-02-12 ENCOUNTER — Other Ambulatory Visit (HOSPITAL_COMMUNITY): Payer: Self-pay

## 2021-02-14 ENCOUNTER — Encounter: Payer: Self-pay | Admitting: Internal Medicine

## 2021-02-16 ENCOUNTER — Telehealth: Payer: Self-pay | Admitting: Internal Medicine

## 2021-02-16 DIAGNOSIS — G4733 Obstructive sleep apnea (adult) (pediatric): Secondary | ICD-10-CM

## 2021-02-16 NOTE — Telephone Encounter (Signed)
Called and spoke with patient who states that she has a new CPAP machine but it is blowing too forceful and she stated that she was informed by the DME to contact Dr. Annamaria Boots for a script for it to be lowered. Download printed and given to Dr. Annamaria Boots  Dr. Annamaria Boots please advise

## 2021-02-16 NOTE — Telephone Encounter (Signed)
Please order DME change CPAP to auto 4-10  Please also schedule routine ov for CPAP f/u here. She was due to revisit [place] 3 months after last ov, but no pending appt.  Thanks

## 2021-02-16 NOTE — Telephone Encounter (Signed)
ATC patient to let her know we were sending order to [facility] to get pressure adjusted. Per DPR left detailed message and also asked her to call the office back to schedule follow up now that she has machine. Advised that we need to see her 31-90 days after she gets machine and that is reqired by insurance.  When patient calls back please schedule her with Dr. Annamaria Boots for follow up. Week of May 23rd has some openings. If only RNA slots please send me message so I can ask him she has to be seen in a certain time frame

## 2021-02-17 ENCOUNTER — Other Ambulatory Visit (HOSPITAL_COMMUNITY): Payer: Self-pay

## 2021-02-17 DIAGNOSIS — J45909 Unspecified asthma, uncomplicated: Secondary | ICD-10-CM | POA: Diagnosis not present

## 2021-02-17 DIAGNOSIS — Z9189 Other specified personal risk factors, not elsewhere classified: Secondary | ICD-10-CM | POA: Diagnosis not present

## 2021-02-17 MED ORDER — ALBUTEROL SULFATE HFA 108 (90 BASE) MCG/ACT IN AERS
2.0000 | INHALATION_SPRAY | RESPIRATORY_TRACT | 1 refills | Status: AC
  Filled 2021-02-17: qty 18, 25d supply, fill #0
  Filled 2021-09-20: qty 18, 17d supply, fill #1

## 2021-02-18 NOTE — Telephone Encounter (Signed)
Called and spoke to pt. Informed her of the recs per CY. Order has already been placed for pressure change. Appt made with CY for 03/31/2021. Pt states she got her CPAP the first week of April. Advised pt why the appt was needed, pt became frustrated and did not want to come in for the appt. Again I stressed the importance of the OV related to her CPAP and insurance coverage. Pt verbalized understanding and denied any further questions or concerns at this time.

## 2021-02-19 ENCOUNTER — Other Ambulatory Visit (HOSPITAL_COMMUNITY): Payer: Self-pay

## 2021-03-01 ENCOUNTER — Other Ambulatory Visit (HOSPITAL_COMMUNITY): Payer: Self-pay

## 2021-03-01 MED ORDER — DOXYCYCLINE HYCLATE 100 MG PO TABS
100.0000 mg | ORAL_TABLET | Freq: Two times a day (BID) | ORAL | 0 refills | Status: AC
  Filled 2021-03-01: qty 14, 7d supply, fill #0

## 2021-03-04 ENCOUNTER — Other Ambulatory Visit (HOSPITAL_COMMUNITY): Payer: Self-pay

## 2021-03-04 MED ORDER — PANTOPRAZOLE SODIUM 40 MG PO TBEC
40.0000 mg | DELAYED_RELEASE_TABLET | Freq: Every morning | ORAL | 1 refills | Status: AC
  Filled 2021-03-04: qty 90, 90d supply, fill #0
  Filled 2021-06-06: qty 90, 90d supply, fill #1

## 2021-03-05 ENCOUNTER — Other Ambulatory Visit (HOSPITAL_COMMUNITY): Payer: Self-pay

## 2021-03-05 DIAGNOSIS — G4733 Obstructive sleep apnea (adult) (pediatric): Secondary | ICD-10-CM | POA: Diagnosis not present

## 2021-03-08 ENCOUNTER — Other Ambulatory Visit (HOSPITAL_COMMUNITY): Payer: Self-pay

## 2021-03-08 MED FILL — Famotidine Tab 20 MG: ORAL | 90 days supply | Qty: 90 | Fill #0 | Status: AC

## 2021-03-14 ENCOUNTER — Other Ambulatory Visit (HOSPITAL_COMMUNITY): Payer: Self-pay

## 2021-03-15 ENCOUNTER — Other Ambulatory Visit (HOSPITAL_COMMUNITY): Payer: Self-pay

## 2021-03-15 MED ORDER — METOPROLOL SUCCINATE ER 100 MG PO TB24
150.0000 mg | ORAL_TABLET | Freq: Every day | ORAL | 1 refills | Status: DC
  Filled 2021-03-15: qty 135, 90d supply, fill #0
  Filled 2021-06-17: qty 135, 90d supply, fill #1

## 2021-03-16 ENCOUNTER — Other Ambulatory Visit (HOSPITAL_COMMUNITY): Payer: Self-pay

## 2021-03-19 ENCOUNTER — Other Ambulatory Visit (HOSPITAL_COMMUNITY): Payer: Self-pay

## 2021-03-20 ENCOUNTER — Other Ambulatory Visit (HOSPITAL_COMMUNITY): Payer: Self-pay

## 2021-03-20 MED ORDER — ROSUVASTATIN CALCIUM 5 MG PO TABS
5.0000 mg | ORAL_TABLET | Freq: Every day | ORAL | 0 refills | Status: DC
  Filled 2021-03-20: qty 90, 90d supply, fill #0

## 2021-03-22 ENCOUNTER — Other Ambulatory Visit (HOSPITAL_COMMUNITY): Payer: Self-pay

## 2021-03-31 ENCOUNTER — Ambulatory Visit: Payer: 59 | Admitting: Internal Medicine

## 2021-04-05 DIAGNOSIS — G4733 Obstructive sleep apnea (adult) (pediatric): Secondary | ICD-10-CM | POA: Diagnosis not present

## 2021-04-06 NOTE — Progress Notes (Signed)
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 Medical problem list includes hx DOE, benign lung nodules w/u 2014, Anxiety, GERD, HTN, Insomnia,  NPSG 08/14/14- AHI 4.8/ hr, desaturation to 85%, mild PLMS, body weight 175 lbs Epworth score-  Body weight today- 181 lbs Covid vax- 3 Phizer Flu vax- had -----Patient is here for snoring and states that she can hear herself breath out and that her husband with wake her and tell her to breath. Does not gasp for air. Wakes up 5-6 times a night.  Never feels rested. Bedtime 8:30-9:00 PM, WASO 5-6, up 6:30-7AM. No sleep meds, 1 cup AM coffee.  Denies ENT surgery, heart or lung disease, neurologic or thyroid problems.. No significant parasomnias.  04/07/21-  64 yoF never smoker followed for OSA, complicated by  DOE, benign lung nodules w/u 2014, Anxiety, GERD, HTN, Insomnia,  HST 12/02/20- AHI 15/ hr, desaturation to a nadir of  73%, body weight 181 lbs CPAP auto 4-10/ [facility] Download- Only used a few days, AHI 1.9/ hr Body weight today-183 lbs Covid vax- -----F/u after CPAP start-feels like dries out, pr. Feels too strong. Wears bruxism guard. Nasal mask. Discussed comfort issues.  We are going to try changing CPAP to fixed 5 cwp.  ROS-see HPI  + = positive Constitutional:    weight loss, night sweats, fevers, chills,+ fatigue, lassitude. HEENT:    headaches, difficulty swallowing, tooth/dental problems, sore throat,       sneezing, itching, ear ache, nasal congestion, post nasal drip, snoring CV:    chest pain, orthopnea, PND, swelling in lower  extremities, anasarca,                                  dizziness, palpitations Resp:   shortness of breath with exertion or at rest.                productive cough,   non-productive cough, coughing up of blood.              change in color of mucus.  wheezing.   Skin:    rash or lesions. GI:  No-   heartburn, indigestion, abdominal pain, nausea, vomiting, diarrhea,                 change in bowel habits, loss of appetite GU: dysuria, change in color of urine, no urgency or frequency.   flank pain. MS:   joint pain, stiffness, decreased range of motion, back pain. Neuro-     nothing unusual Psych:  change in mood or affect.  depression or anxiety.   memory loss.  OBJ- Physical Exam General- Alert, Oriented, Affect-appropriate, Distress- none acute Skin- rash-none, lesions- none, excoriation- none Lymphadenopathy- none Head- atraumatic            Eyes- Gross vision intact, PERRLA, conjunctivae and secretions clear            Ears- Hearing, canals-normal            Nose- Clear, no-Septal dev, mucus, polyps, erosion, perforation  Throat- Mallampati IV , mucosa clear / +minimally dry, drainage- none, tonsils- atrophic, + teeth Neck- flexible , trachea midline, no stridor , thyroid nl, carotid no bruit Chest - symmetrical excursion , unlabored           Heart/CV- RRR , no murmur , no gallop  , no rub, nl s1 s2                           - JVD- none , edema- none, stasis changes- none, varices- none           Lung- clear to P&A, wheeze- none, cough- none , dullness-none, rub- none           Chest wall-  Abd-  Br/ Gen/ Rectal- Not done, not indicated Extrem- cyanosis- none, clubbing, none, atrophy- none, strength- nl Neuro- grossly intact to observation

## 2021-04-07 ENCOUNTER — Encounter: Payer: Self-pay | Admitting: Internal Medicine

## 2021-04-07 ENCOUNTER — Other Ambulatory Visit: Payer: Self-pay

## 2021-04-07 ENCOUNTER — Ambulatory Visit: Payer: 59 | Admitting: Internal Medicine

## 2021-04-07 VITALS — BP 124/70 | HR 75 | Temp 98.0°F | Ht 66.0 in | Wt 183.2 lb

## 2021-04-07 DIAGNOSIS — G4733 Obstructive sleep apnea (adult) (pediatric): Secondary | ICD-10-CM

## 2021-04-07 DIAGNOSIS — R682 Dry mouth, unspecified: Secondary | ICD-10-CM

## 2021-04-07 DIAGNOSIS — Z9989 Dependence on other enabling machines and devices: Secondary | ICD-10-CM

## 2021-04-07 NOTE — Patient Instructions (Signed)
Order- DME [facility]- please change CPAP to fixed 5 cwp  Suggest trying Biotene mouth rinse every night at bedtime  You can also try otc nasal saline gel (AYR, NeilMed, etc)

## 2021-04-29 ENCOUNTER — Other Ambulatory Visit (HOSPITAL_COMMUNITY): Payer: Self-pay

## 2021-04-29 MED FILL — Amlodipine Besylate Tab 5 MG (Base Equivalent): ORAL | 90 days supply | Qty: 90 | Fill #0 | Status: AC

## 2021-05-05 DIAGNOSIS — G4733 Obstructive sleep apnea (adult) (pediatric): Secondary | ICD-10-CM | POA: Diagnosis not present

## 2021-05-14 ENCOUNTER — Other Ambulatory Visit (HOSPITAL_COMMUNITY): Payer: Self-pay

## 2021-05-14 MED ORDER — POTASSIUM CHLORIDE CRYS ER 20 MEQ PO TBCR
20.0000 meq | EXTENDED_RELEASE_TABLET | Freq: Every day | ORAL | 0 refills | Status: DC
  Filled 2021-05-14: qty 90, 90d supply, fill #0

## 2021-05-20 DIAGNOSIS — G4733 Obstructive sleep apnea (adult) (pediatric): Secondary | ICD-10-CM | POA: Diagnosis not present

## 2021-06-05 DIAGNOSIS — G4733 Obstructive sleep apnea (adult) (pediatric): Secondary | ICD-10-CM | POA: Diagnosis not present

## 2021-06-06 ENCOUNTER — Other Ambulatory Visit (HOSPITAL_COMMUNITY): Payer: Self-pay

## 2021-06-07 ENCOUNTER — Other Ambulatory Visit (HOSPITAL_COMMUNITY): Payer: Self-pay

## 2021-06-07 MED ORDER — CARESTART COVID-19 HOME TEST VI KIT
PACK | 0 refills | Status: AC
  Filled 2021-06-07: qty 4, 4d supply, fill #0

## 2021-06-07 MED ORDER — CHLORTHALIDONE 25 MG PO TABS
12.5000 mg | ORAL_TABLET | Freq: Every morning | ORAL | 0 refills | Status: AC
  Filled 2021-06-07: qty 30, 60d supply, fill #0

## 2021-06-07 MED FILL — Chlorthalidone Tab 25 MG: ORAL | 90 days supply | Qty: 90 | Fill #0 | Status: AC

## 2021-06-14 ENCOUNTER — Other Ambulatory Visit (HOSPITAL_COMMUNITY): Payer: Self-pay

## 2021-06-15 ENCOUNTER — Other Ambulatory Visit (HOSPITAL_COMMUNITY): Payer: Self-pay

## 2021-06-15 MED ORDER — FAMOTIDINE 20 MG PO TABS
20.0000 mg | ORAL_TABLET | Freq: Every day | ORAL | 0 refills | Status: DC
  Filled 2021-06-15: qty 30, 30d supply, fill #0

## 2021-06-17 ENCOUNTER — Other Ambulatory Visit (HOSPITAL_COMMUNITY): Payer: Self-pay

## 2021-06-17 MED ORDER — ROSUVASTATIN CALCIUM 5 MG PO TABS
5.0000 mg | ORAL_TABLET | Freq: Every day | ORAL | 0 refills | Status: DC
  Filled 2021-06-17: qty 90, 90d supply, fill #0

## 2021-07-06 DIAGNOSIS — G4733 Obstructive sleep apnea (adult) (pediatric): Secondary | ICD-10-CM | POA: Diagnosis not present

## 2021-07-07 ENCOUNTER — Encounter: Payer: Self-pay | Admitting: Internal Medicine

## 2021-07-07 DIAGNOSIS — R682 Dry mouth, unspecified: Secondary | ICD-10-CM | POA: Insufficient documentation

## 2021-07-07 NOTE — Assessment & Plan Note (Signed)
Not obviously pathologic and may reflect her diuretic. Plan- Biotene

## 2021-07-07 NOTE — Assessment & Plan Note (Signed)
We need to improve comfort or she won't be able to stay with CPAP Plan- change autopap to fixed 5 cwp

## 2021-07-07 NOTE — Progress Notes (Deleted)
HPI F never smoker followed for OSA, complicated by  DOE, benign lung nodules w/u 2014, Anxiety, GERD, HTN, Insomnia,  NPSG 08/14/14- AHI 4.8/ hr, desaturation to 85%, mild PLMS, body weight 175 lbs HST 12/02/20- AHI 15/ hr, desaturation to a nadir of  73%, body weight 181 lbs  ====================================================================   04/07/21-  64 yoF never smoker followed for OSA, complicated by  DOE, benign lung nodules w/u 2014, Anxiety, GERD, HTN, Insomnia,  HST 12/02/20- AHI 15/ hr, desaturation to a nadir of  73%, body weight 181 lbs CPAP auto 4-10/ [facility] Download- Only used a few days, AHI 1.9/ hr Body weight today-183 lbs Covid vax- -----F/u after CPAP start-feels like dries out, pr. Feels too strong. Wears bruxism guard. Nasal mask. Discussed comfort issues.  We are going to try changing CPAP to fixed 5 cwp.  07/08/21- 64 yoF never smoker followed for OSA, complicated by  DOE, benign lung nodules w/u 2014, Anxiety, GERD, HTN, Insomnia,  CPAP 5/ [facility] Download- Bocy weight today- Covid vax- We had reduced pressure due to try mouth. My need to try OAP.  ROS-see HPI  + = positive Constitutional:    weight loss, night sweats, fevers, chills,+ fatigue, lassitude. HEENT:    headaches, difficulty swallowing, tooth/dental problems, sore throat,       sneezing, itching, ear ache, nasal congestion, post nasal drip, snoring CV:    chest pain, orthopnea, PND, swelling in lower extremities, anasarca,                                  dizziness, palpitations Resp:   shortness of breath with exertion or at rest.                productive cough,   non-productive cough, coughing up of blood.              change in color of mucus.  wheezing.   Skin:    rash or lesions. GI:  No-   heartburn, indigestion, abdominal pain, nausea, vomiting, diarrhea,                 change in bowel habits, loss of appetite GU: dysuria, change in color of urine, no urgency or frequency.   flank pain. MS:    joint pain, stiffness, decreased range of motion, back pain. Neuro-     nothing unusual Psych:  change in mood or affect.  depression or anxiety.   memory loss.  OBJ- Physical Exam General- Alert, Oriented, Affect-appropriate, Distress- none acute Skin- rash-none, lesions- none, excoriation- none Lymphadenopathy- none Head- atraumatic            Eyes- Gross vision intact, PERRLA, conjunctivae and secretions clear            Ears- Hearing, canals-normal            Nose- Clear, no-Septal dev, mucus, polyps, erosion, perforation             Throat- Mallampati IV , mucosa clear / +minimally dry, drainage- none, tonsils- atrophic, + teeth Neck- flexible , trachea midline, no stridor , thyroid nl, carotid no bruit Chest - symmetrical excursion , unlabored           Heart/CV- RRR , no murmur , no gallop  , no rub, nl s1 s2                           -  JVD- none , edema- none, stasis changes- none, varices- none           Lung- clear to P&A, wheeze- none, cough- none , dullness-none, rub- none           Chest wall-  Abd-  Br/ Gen/ Rectal- Not done, not indicated Extrem- cyanosis- none, clubbing, none, atrophy- none, strength- nl Neuro- grossly intact to observation

## 2021-07-08 ENCOUNTER — Ambulatory Visit: Payer: 59 | Admitting: Internal Medicine

## 2021-07-21 ENCOUNTER — Other Ambulatory Visit (HOSPITAL_COMMUNITY): Payer: Self-pay

## 2021-07-21 DIAGNOSIS — F411 Generalized anxiety disorder: Secondary | ICD-10-CM | POA: Diagnosis not present

## 2021-07-21 DIAGNOSIS — E119 Type 2 diabetes mellitus without complications: Secondary | ICD-10-CM | POA: Diagnosis not present

## 2021-07-21 DIAGNOSIS — I10 Essential (primary) hypertension: Secondary | ICD-10-CM | POA: Diagnosis not present

## 2021-07-21 DIAGNOSIS — G4701 Insomnia due to medical condition: Secondary | ICD-10-CM | POA: Diagnosis not present

## 2021-07-21 DIAGNOSIS — E782 Mixed hyperlipidemia: Secondary | ICD-10-CM | POA: Diagnosis not present

## 2021-07-21 DIAGNOSIS — K219 Gastro-esophageal reflux disease without esophagitis: Secondary | ICD-10-CM | POA: Diagnosis not present

## 2021-07-21 MED ORDER — FAMOTIDINE 20 MG PO TABS
20.0000 mg | ORAL_TABLET | Freq: Every day | ORAL | 1 refills | Status: DC
  Filled 2021-07-21: qty 90, 90d supply, fill #0
  Filled 2021-10-25: qty 90, 90d supply, fill #1

## 2021-07-21 MED ORDER — OZEMPIC (0.25 OR 0.5 MG/DOSE) 2 MG/1.5ML ~~LOC~~ SOPN
0.2500 mg | PEN_INJECTOR | SUBCUTANEOUS | 1 refills | Status: AC
  Filled 2021-07-21: qty 1.5, 56d supply, fill #0

## 2021-08-02 ENCOUNTER — Other Ambulatory Visit (HOSPITAL_COMMUNITY): Payer: Self-pay

## 2021-08-03 ENCOUNTER — Other Ambulatory Visit (HOSPITAL_COMMUNITY): Payer: Self-pay

## 2021-08-03 MED ORDER — AMLODIPINE BESYLATE 5 MG PO TABS
5.0000 mg | ORAL_TABLET | Freq: Every day | ORAL | 1 refills | Status: DC
  Filled 2021-08-03: qty 90, 90d supply, fill #0
  Filled 2021-11-04: qty 90, 90d supply, fill #1

## 2021-08-05 DIAGNOSIS — G4733 Obstructive sleep apnea (adult) (pediatric): Secondary | ICD-10-CM | POA: Diagnosis not present

## 2021-08-16 ENCOUNTER — Other Ambulatory Visit (HOSPITAL_COMMUNITY): Payer: Self-pay

## 2021-08-17 ENCOUNTER — Other Ambulatory Visit (HOSPITAL_COMMUNITY): Payer: Self-pay

## 2021-08-17 MED ORDER — POTASSIUM CHLORIDE CRYS ER 20 MEQ PO TBCR
20.0000 meq | EXTENDED_RELEASE_TABLET | Freq: Every day | ORAL | 0 refills | Status: DC
  Filled 2021-08-17: qty 90, 90d supply, fill #0

## 2021-08-17 MED ORDER — METFORMIN HCL 500 MG PO TABS
500.0000 mg | ORAL_TABLET | Freq: Every day | ORAL | 1 refills | Status: DC
  Filled 2021-08-17: qty 90, 90d supply, fill #0
  Filled 2021-11-12: qty 90, 90d supply, fill #1

## 2021-08-25 DIAGNOSIS — D225 Melanocytic nevi of trunk: Secondary | ICD-10-CM | POA: Diagnosis not present

## 2021-08-25 DIAGNOSIS — Z85828 Personal history of other malignant neoplasm of skin: Secondary | ICD-10-CM | POA: Diagnosis not present

## 2021-08-25 DIAGNOSIS — B078 Other viral warts: Secondary | ICD-10-CM | POA: Diagnosis not present

## 2021-08-25 DIAGNOSIS — L821 Other seborrheic keratosis: Secondary | ICD-10-CM | POA: Diagnosis not present

## 2021-08-25 DIAGNOSIS — L739 Follicular disorder, unspecified: Secondary | ICD-10-CM | POA: Diagnosis not present

## 2021-08-25 DIAGNOSIS — L720 Epidermal cyst: Secondary | ICD-10-CM | POA: Diagnosis not present

## 2021-08-25 DIAGNOSIS — D485 Neoplasm of uncertain behavior of skin: Secondary | ICD-10-CM | POA: Diagnosis not present

## 2021-08-25 DIAGNOSIS — B079 Viral wart, unspecified: Secondary | ICD-10-CM | POA: Diagnosis not present

## 2021-08-26 ENCOUNTER — Other Ambulatory Visit (HOSPITAL_COMMUNITY): Payer: Self-pay

## 2021-08-26 MED ORDER — OZEMPIC (0.25 OR 0.5 MG/DOSE) 2 MG/1.5ML ~~LOC~~ SOPN
0.5000 mg | PEN_INJECTOR | SUBCUTANEOUS | 1 refills | Status: AC
  Filled 2021-08-26: qty 1.5, 28d supply, fill #0
  Filled 2021-08-26: qty 1.5, 56d supply, fill #0
  Filled 2021-09-28: qty 1.5, 28d supply, fill #1

## 2021-09-05 DIAGNOSIS — G4733 Obstructive sleep apnea (adult) (pediatric): Secondary | ICD-10-CM | POA: Diagnosis not present

## 2021-09-06 ENCOUNTER — Other Ambulatory Visit: Payer: Self-pay | Admitting: Student

## 2021-09-06 DIAGNOSIS — M5412 Radiculopathy, cervical region: Secondary | ICD-10-CM

## 2021-09-07 ENCOUNTER — Ambulatory Visit
Admission: RE | Admit: 2021-09-07 | Discharge: 2021-09-07 | Disposition: A | Discharge status: Home / Self Care | Payer: 59 | Source: Ambulatory Visit | Attending: Student | Admitting: Student

## 2021-09-07 ENCOUNTER — Other Ambulatory Visit: Payer: Self-pay

## 2021-09-07 DIAGNOSIS — M4722 Other spondylosis with radiculopathy, cervical region: Secondary | ICD-10-CM | POA: Diagnosis not present

## 2021-09-07 DIAGNOSIS — M5412 Radiculopathy, cervical region: Secondary | ICD-10-CM

## 2021-09-07 MED ORDER — IOPAMIDOL (ISOVUE-M 300) INJECTION 61%
1.0000 mL | Freq: Once | INTRAMUSCULAR | Status: AC
  Administered 2021-09-07: 1 mL via EPIDURAL

## 2021-09-07 MED ORDER — TRIAMCINOLONE ACETONIDE 40 MG/ML IJ SUSP (RADIOLOGY)
60.0000 mg | Freq: Once | INTRAMUSCULAR | Status: AC
  Administered 2021-09-07: 60 mg via EPIDURAL

## 2021-09-07 NOTE — Discharge Instructions (Signed)

## 2021-09-08 ENCOUNTER — Other Ambulatory Visit (HOSPITAL_COMMUNITY): Payer: Self-pay

## 2021-09-08 MED ORDER — PANTOPRAZOLE SODIUM 40 MG PO TBEC
40.0000 mg | DELAYED_RELEASE_TABLET | Freq: Every morning | ORAL | 1 refills | Status: AC
  Filled 2021-09-08: qty 90, 90d supply, fill #0

## 2021-09-08 MED FILL — Pantoprazole Sodium EC Tab 40 MG (Base Equiv): ORAL | 90 days supply | Qty: 90 | Fill #0 | Status: AC

## 2021-09-17 ENCOUNTER — Other Ambulatory Visit (HOSPITAL_COMMUNITY): Payer: Self-pay

## 2021-09-17 DIAGNOSIS — R059 Cough, unspecified: Secondary | ICD-10-CM | POA: Diagnosis not present

## 2021-09-17 MED ORDER — AZITHROMYCIN 250 MG PO TABS
ORAL_TABLET | ORAL | 0 refills | Status: AC
  Filled 2021-09-17: qty 6, 5d supply, fill #0

## 2021-09-19 ENCOUNTER — Other Ambulatory Visit (HOSPITAL_COMMUNITY): Payer: Self-pay

## 2021-09-20 ENCOUNTER — Other Ambulatory Visit (HOSPITAL_COMMUNITY): Payer: Self-pay

## 2021-09-20 MED ORDER — ROSUVASTATIN CALCIUM 5 MG PO TABS
5.0000 mg | ORAL_TABLET | Freq: Every day | ORAL | 0 refills | Status: DC
  Filled 2021-09-20: qty 90, 90d supply, fill #0

## 2021-09-20 MED ORDER — METOPROLOL SUCCINATE ER 100 MG PO TB24
150.0000 mg | ORAL_TABLET | Freq: Every day | ORAL | 1 refills | Status: DC
  Filled 2021-09-20: qty 135, 90d supply, fill #0
  Filled 2021-12-24: qty 135, 90d supply, fill #1

## 2021-09-28 ENCOUNTER — Other Ambulatory Visit (HOSPITAL_COMMUNITY): Payer: Self-pay

## 2021-10-08 ENCOUNTER — Other Ambulatory Visit: Payer: Self-pay | Admitting: Neurological Surgery

## 2021-10-08 DIAGNOSIS — M5412 Radiculopathy, cervical region: Secondary | ICD-10-CM

## 2021-10-13 ENCOUNTER — Other Ambulatory Visit (HOSPITAL_COMMUNITY): Payer: Self-pay

## 2021-10-13 DIAGNOSIS — E119 Type 2 diabetes mellitus without complications: Secondary | ICD-10-CM | POA: Diagnosis not present

## 2021-10-13 MED ORDER — OZEMPIC (1 MG/DOSE) 4 MG/3ML ~~LOC~~ SOPN
1.0000 mg | PEN_INJECTOR | SUBCUTANEOUS | 1 refills | Status: DC
  Filled 2021-10-13: qty 3, 28d supply, fill #0
  Filled 2021-11-10: qty 3, 28d supply, fill #1

## 2021-10-26 ENCOUNTER — Other Ambulatory Visit (HOSPITAL_COMMUNITY): Payer: Self-pay

## 2021-10-26 ENCOUNTER — Other Ambulatory Visit: Payer: 59

## 2021-11-04 ENCOUNTER — Other Ambulatory Visit (HOSPITAL_COMMUNITY): Payer: Self-pay

## 2021-11-10 ENCOUNTER — Other Ambulatory Visit (HOSPITAL_COMMUNITY): Payer: Self-pay

## 2021-11-11 ENCOUNTER — Ambulatory Visit
Admission: RE | Admit: 2021-11-11 | Discharge: 2021-11-11 | Disposition: A | Discharge status: Home / Self Care | Payer: 59 | Source: Ambulatory Visit | Attending: Neurological Surgery | Admitting: Neurological Surgery

## 2021-11-11 DIAGNOSIS — E7800 Pure hypercholesterolemia, unspecified: Secondary | ICD-10-CM | POA: Diagnosis not present

## 2021-11-11 DIAGNOSIS — M4802 Spinal stenosis, cervical region: Secondary | ICD-10-CM | POA: Diagnosis not present

## 2021-11-11 DIAGNOSIS — R5383 Other fatigue: Secondary | ICD-10-CM | POA: Diagnosis not present

## 2021-11-11 DIAGNOSIS — E663 Overweight: Secondary | ICD-10-CM | POA: Diagnosis not present

## 2021-11-11 DIAGNOSIS — E119 Type 2 diabetes mellitus without complications: Secondary | ICD-10-CM | POA: Diagnosis not present

## 2021-11-11 DIAGNOSIS — I10 Essential (primary) hypertension: Secondary | ICD-10-CM | POA: Diagnosis not present

## 2021-11-11 DIAGNOSIS — M50223 Other cervical disc displacement at C6-C7 level: Secondary | ICD-10-CM | POA: Diagnosis not present

## 2021-11-11 DIAGNOSIS — M5412 Radiculopathy, cervical region: Secondary | ICD-10-CM

## 2021-11-11 DIAGNOSIS — F411 Generalized anxiety disorder: Secondary | ICD-10-CM | POA: Diagnosis not present

## 2021-11-11 MED ORDER — GADOBENATE DIMEGLUMINE 529 MG/ML IV SOLN
16.0000 mL | Freq: Once | INTRAVENOUS | Status: AC | PRN
  Administered 2021-11-11: 16 mL via INTRAVENOUS

## 2021-11-12 ENCOUNTER — Other Ambulatory Visit (HOSPITAL_COMMUNITY): Payer: Self-pay

## 2021-11-12 MED ORDER — POTASSIUM CHLORIDE CRYS ER 20 MEQ PO TBCR
20.0000 meq | EXTENDED_RELEASE_TABLET | Freq: Every day | ORAL | 0 refills | Status: AC
  Filled 2021-11-12: qty 90, 90d supply, fill #0

## 2021-11-12 MED FILL — Chlorthalidone Tab 25 MG: ORAL | 90 days supply | Qty: 90 | Fill #1 | Status: AC

## 2021-11-23 DIAGNOSIS — M5412 Radiculopathy, cervical region: Secondary | ICD-10-CM | POA: Diagnosis not present

## 2021-12-13 ENCOUNTER — Other Ambulatory Visit (HOSPITAL_COMMUNITY): Payer: Self-pay

## 2021-12-13 MED ORDER — OZEMPIC (1 MG/DOSE) 4 MG/3ML ~~LOC~~ SOPN
1.0000 mg | PEN_INJECTOR | SUBCUTANEOUS | 1 refills | Status: AC
  Filled 2021-12-13: qty 3, 28d supply, fill #0
  Filled 2022-01-05: qty 3, 28d supply, fill #1

## 2021-12-13 MED FILL — Pantoprazole Sodium EC Tab 40 MG (Base Equiv): ORAL | 90 days supply | Qty: 90 | Fill #1 | Status: AC

## 2021-12-24 ENCOUNTER — Other Ambulatory Visit (HOSPITAL_COMMUNITY): Payer: Self-pay

## 2021-12-24 MED ORDER — ROSUVASTATIN CALCIUM 5 MG PO TABS
5.0000 mg | ORAL_TABLET | Freq: Every day | ORAL | 0 refills | Status: DC
  Filled 2021-12-24: qty 57, 57d supply, fill #0
  Filled 2021-12-24: qty 33, 33d supply, fill #0

## 2022-01-05 ENCOUNTER — Other Ambulatory Visit (HOSPITAL_COMMUNITY): Payer: Self-pay

## 2022-01-12 DIAGNOSIS — E782 Mixed hyperlipidemia: Secondary | ICD-10-CM | POA: Diagnosis not present

## 2022-01-12 DIAGNOSIS — R059 Cough, unspecified: Secondary | ICD-10-CM | POA: Diagnosis not present

## 2022-01-12 DIAGNOSIS — E119 Type 2 diabetes mellitus without complications: Secondary | ICD-10-CM | POA: Diagnosis not present

## 2022-01-12 DIAGNOSIS — Z0000 Encounter for general adult medical examination without abnormal findings: Secondary | ICD-10-CM | POA: Diagnosis not present

## 2022-01-20 ENCOUNTER — Other Ambulatory Visit (HOSPITAL_COMMUNITY): Payer: Self-pay

## 2022-01-20 MED ORDER — FAMOTIDINE 20 MG PO TABS
20.0000 mg | ORAL_TABLET | Freq: Every day | ORAL | 3 refills | Status: AC
  Filled 2022-01-20: qty 90, 90d supply, fill #0
  Filled 2022-04-24: qty 90, 90d supply, fill #1
  Filled 2022-05-14 – 2022-07-28 (×2): qty 90, 90d supply, fill #2
  Filled 2022-10-26: qty 90, 90d supply, fill #3

## 2022-01-26 ENCOUNTER — Other Ambulatory Visit (HOSPITAL_COMMUNITY): Payer: Self-pay

## 2022-01-27 ENCOUNTER — Other Ambulatory Visit (HOSPITAL_COMMUNITY): Payer: Self-pay

## 2022-01-27 MED ORDER — AMLODIPINE BESYLATE 5 MG PO TABS
5.0000 mg | ORAL_TABLET | Freq: Every day | ORAL | 1 refills | Status: DC
  Filled 2022-01-27: qty 90, 90d supply, fill #0
  Filled 2022-04-24: qty 90, 90d supply, fill #1

## 2022-02-02 ENCOUNTER — Other Ambulatory Visit (HOSPITAL_COMMUNITY): Payer: Self-pay

## 2022-02-02 MED ORDER — OZEMPIC (2 MG/DOSE) 8 MG/3ML ~~LOC~~ SOPN
2.0000 mg | PEN_INJECTOR | SUBCUTANEOUS | 2 refills | Status: DC
  Filled 2022-02-02: qty 3, 28d supply, fill #0
  Filled 2022-03-04: qty 3, 28d supply, fill #1
  Filled 2022-03-27: qty 3, 28d supply, fill #2

## 2022-02-13 ENCOUNTER — Other Ambulatory Visit (HOSPITAL_COMMUNITY): Payer: Self-pay

## 2022-02-14 ENCOUNTER — Other Ambulatory Visit (HOSPITAL_COMMUNITY): Payer: Self-pay

## 2022-02-14 MED ORDER — VENLAFAXINE HCL ER 37.5 MG PO CP24
37.5000 mg | ORAL_CAPSULE | Freq: Every day | ORAL | 0 refills | Status: DC
  Filled 2022-02-14: qty 90, 90d supply, fill #0

## 2022-02-14 MED ORDER — METFORMIN HCL 500 MG PO TABS
500.0000 mg | ORAL_TABLET | Freq: Every day | ORAL | 1 refills | Status: DC
  Filled 2022-02-14: qty 90, 90d supply, fill #0
  Filled 2022-05-13: qty 90, 90d supply, fill #1

## 2022-02-15 ENCOUNTER — Other Ambulatory Visit (HOSPITAL_COMMUNITY): Payer: Self-pay

## 2022-03-04 ENCOUNTER — Other Ambulatory Visit (HOSPITAL_COMMUNITY): Payer: Self-pay

## 2022-03-14 ENCOUNTER — Other Ambulatory Visit (HOSPITAL_COMMUNITY): Payer: Self-pay

## 2022-03-14 MED ORDER — METOPROLOL SUCCINATE ER 100 MG PO TB24
150.0000 mg | ORAL_TABLET | Freq: Every day | ORAL | 1 refills | Status: DC
  Filled 2022-03-14: qty 135, 90d supply, fill #0
  Filled 2022-06-21 (×2): qty 135, 90d supply, fill #1

## 2022-03-14 MED ORDER — PANTOPRAZOLE SODIUM 40 MG PO TBEC
40.0000 mg | DELAYED_RELEASE_TABLET | Freq: Every morning | ORAL | 0 refills | Status: DC
  Filled 2022-03-14: qty 90, 90d supply, fill #0

## 2022-03-14 MED ORDER — ROSUVASTATIN CALCIUM 5 MG PO TABS
5.0000 mg | ORAL_TABLET | Freq: Every day | ORAL | 1 refills | Status: DC
  Filled 2022-03-14 (×2): qty 90, 90d supply, fill #0
  Filled 2022-06-14: qty 90, 90d supply, fill #1

## 2022-03-15 ENCOUNTER — Other Ambulatory Visit (HOSPITAL_COMMUNITY): Payer: Self-pay

## 2022-03-15 MED ORDER — CHLORTHALIDONE 25 MG PO TABS
12.5000 mg | ORAL_TABLET | Freq: Every morning | ORAL | 1 refills | Status: DC
  Filled 2022-03-15: qty 45, 90d supply, fill #0
  Filled 2022-06-14: qty 5, 10d supply, fill #1
  Filled 2022-06-14: qty 40, 80d supply, fill #1

## 2022-03-16 ENCOUNTER — Other Ambulatory Visit (HOSPITAL_COMMUNITY): Payer: Self-pay

## 2022-03-17 ENCOUNTER — Other Ambulatory Visit (HOSPITAL_COMMUNITY): Payer: Self-pay

## 2022-03-18 ENCOUNTER — Other Ambulatory Visit (HOSPITAL_COMMUNITY): Payer: Self-pay

## 2022-03-29 ENCOUNTER — Other Ambulatory Visit (HOSPITAL_COMMUNITY): Payer: Self-pay

## 2022-04-24 ENCOUNTER — Other Ambulatory Visit (HOSPITAL_COMMUNITY): Payer: Self-pay

## 2022-04-25 ENCOUNTER — Other Ambulatory Visit (HOSPITAL_COMMUNITY): Payer: Self-pay

## 2022-04-26 ENCOUNTER — Other Ambulatory Visit (HOSPITAL_COMMUNITY): Payer: Self-pay

## 2022-04-26 MED ORDER — OZEMPIC (2 MG/DOSE) 8 MG/3ML ~~LOC~~ SOPN
2.0000 mg | PEN_INJECTOR | SUBCUTANEOUS | 2 refills | Status: DC
  Filled 2022-04-26: qty 3, 28d supply, fill #0
  Filled 2022-05-22 – 2022-05-24 (×2): qty 3, 28d supply, fill #1
  Filled 2022-06-21: qty 3, 28d supply, fill #2

## 2022-05-08 ENCOUNTER — Other Ambulatory Visit (HOSPITAL_COMMUNITY): Payer: Self-pay

## 2022-05-09 ENCOUNTER — Other Ambulatory Visit (HOSPITAL_COMMUNITY): Payer: Self-pay

## 2022-05-10 ENCOUNTER — Other Ambulatory Visit (HOSPITAL_COMMUNITY): Payer: Self-pay

## 2022-05-13 ENCOUNTER — Other Ambulatory Visit (HOSPITAL_COMMUNITY): Payer: Self-pay

## 2022-05-13 MED ORDER — VENLAFAXINE HCL ER 37.5 MG PO CP24
37.5000 mg | ORAL_CAPSULE | Freq: Every day | ORAL | 0 refills | Status: DC
  Filled 2022-05-13: qty 90, 90d supply, fill #0

## 2022-05-14 ENCOUNTER — Other Ambulatory Visit (HOSPITAL_COMMUNITY): Payer: Self-pay

## 2022-05-16 ENCOUNTER — Other Ambulatory Visit (HOSPITAL_COMMUNITY): Payer: Self-pay

## 2022-05-17 ENCOUNTER — Other Ambulatory Visit (HOSPITAL_COMMUNITY): Payer: Self-pay

## 2022-05-18 ENCOUNTER — Other Ambulatory Visit (HOSPITAL_COMMUNITY): Payer: Self-pay

## 2022-05-18 DIAGNOSIS — Z6827 Body mass index (BMI) 27.0-27.9, adult: Secondary | ICD-10-CM | POA: Diagnosis not present

## 2022-05-18 DIAGNOSIS — Z01419 Encounter for gynecological examination (general) (routine) without abnormal findings: Secondary | ICD-10-CM | POA: Diagnosis not present

## 2022-05-18 MED ORDER — VENLAFAXINE HCL ER 37.5 MG PO CP24
37.5000 mg | ORAL_CAPSULE | Freq: Every day | ORAL | 3 refills | Status: AC
Start: 2022-05-18 — End: ?
  Filled 2022-05-18 – 2022-08-03 (×2): qty 90, 90d supply, fill #0
  Filled 2022-11-07: qty 90, 90d supply, fill #1
  Filled 2023-02-03: qty 90, 90d supply, fill #2
  Filled 2023-05-11: qty 90, 90d supply, fill #3

## 2022-05-18 MED ORDER — AMLODIPINE BESYLATE 5 MG PO TABS
5.0000 mg | ORAL_TABLET | Freq: Every day | ORAL | 0 refills | Status: DC
  Filled 2022-05-18 – 2022-07-28 (×2): qty 90, 90d supply, fill #0

## 2022-05-20 ENCOUNTER — Other Ambulatory Visit (HOSPITAL_COMMUNITY): Payer: Self-pay

## 2022-05-23 ENCOUNTER — Other Ambulatory Visit (HOSPITAL_COMMUNITY): Payer: Self-pay

## 2022-05-23 MED ORDER — POTASSIUM CHLORIDE CRYS ER 20 MEQ PO TBCR
20.0000 meq | EXTENDED_RELEASE_TABLET | Freq: Every day | ORAL | 1 refills | Status: DC
  Filled 2022-05-23: qty 90, 90d supply, fill #0
  Filled 2022-08-14: qty 90, 90d supply, fill #1

## 2022-05-24 ENCOUNTER — Other Ambulatory Visit (HOSPITAL_COMMUNITY): Payer: Self-pay

## 2022-05-24 ENCOUNTER — Other Ambulatory Visit (HOSPITAL_BASED_OUTPATIENT_CLINIC_OR_DEPARTMENT_OTHER): Payer: Self-pay

## 2022-05-25 ENCOUNTER — Other Ambulatory Visit (HOSPITAL_BASED_OUTPATIENT_CLINIC_OR_DEPARTMENT_OTHER): Payer: Self-pay

## 2022-06-01 DIAGNOSIS — Z1231 Encounter for screening mammogram for malignant neoplasm of breast: Secondary | ICD-10-CM | POA: Diagnosis not present

## 2022-06-06 ENCOUNTER — Other Ambulatory Visit: Payer: Self-pay | Admitting: Obstetrics & Gynecology

## 2022-06-06 DIAGNOSIS — R928 Other abnormal and inconclusive findings on diagnostic imaging of breast: Secondary | ICD-10-CM

## 2022-06-14 ENCOUNTER — Other Ambulatory Visit (HOSPITAL_COMMUNITY): Payer: Self-pay

## 2022-06-14 MED ORDER — PANTOPRAZOLE SODIUM 40 MG PO TBEC
40.0000 mg | DELAYED_RELEASE_TABLET | Freq: Every morning | ORAL | 0 refills | Status: DC
Start: 2022-06-14 — End: 2022-09-12
  Filled 2022-06-14: qty 90, 90d supply, fill #0

## 2022-06-21 ENCOUNTER — Other Ambulatory Visit (HOSPITAL_COMMUNITY): Payer: Self-pay

## 2022-06-22 ENCOUNTER — Other Ambulatory Visit (HOSPITAL_COMMUNITY): Payer: Self-pay

## 2022-06-23 ENCOUNTER — Ambulatory Visit
Admission: RE | Admit: 2022-06-23 | Discharge: 2022-06-23 | Disposition: A | Discharge status: Home / Self Care | Payer: 59 | Source: Ambulatory Visit | Attending: Obstetrics & Gynecology | Admitting: Obstetrics & Gynecology

## 2022-06-23 DIAGNOSIS — R922 Inconclusive mammogram: Secondary | ICD-10-CM | POA: Diagnosis not present

## 2022-06-23 DIAGNOSIS — N6001 Solitary cyst of right breast: Secondary | ICD-10-CM | POA: Diagnosis not present

## 2022-06-23 DIAGNOSIS — R928 Other abnormal and inconclusive findings on diagnostic imaging of breast: Secondary | ICD-10-CM

## 2022-07-13 ENCOUNTER — Other Ambulatory Visit (HOSPITAL_COMMUNITY): Payer: Self-pay

## 2022-07-13 DIAGNOSIS — Z79899 Other long term (current) drug therapy: Secondary | ICD-10-CM | POA: Diagnosis not present

## 2022-07-13 DIAGNOSIS — E1165 Type 2 diabetes mellitus with hyperglycemia: Secondary | ICD-10-CM | POA: Diagnosis not present

## 2022-07-13 DIAGNOSIS — E782 Mixed hyperlipidemia: Secondary | ICD-10-CM | POA: Diagnosis not present

## 2022-07-13 DIAGNOSIS — I10 Essential (primary) hypertension: Secondary | ICD-10-CM | POA: Diagnosis not present

## 2022-07-13 DIAGNOSIS — F411 Generalized anxiety disorder: Secondary | ICD-10-CM | POA: Diagnosis not present

## 2022-07-13 DIAGNOSIS — K219 Gastro-esophageal reflux disease without esophagitis: Secondary | ICD-10-CM | POA: Diagnosis not present

## 2022-07-13 DIAGNOSIS — Z7985 Long-term (current) use of injectable non-insulin antidiabetic drugs: Secondary | ICD-10-CM | POA: Diagnosis not present

## 2022-07-13 DIAGNOSIS — Z7984 Long term (current) use of oral hypoglycemic drugs: Secondary | ICD-10-CM | POA: Diagnosis not present

## 2022-07-13 MED ORDER — OZEMPIC (2 MG/DOSE) 8 MG/3ML ~~LOC~~ SOPN
2.0000 mg | PEN_INJECTOR | SUBCUTANEOUS | 2 refills | Status: DC
  Filled 2022-07-13: qty 3, 28d supply, fill #0
  Filled 2022-08-09: qty 3, 28d supply, fill #1
  Filled 2022-09-08 – 2022-09-09 (×2): qty 3, 28d supply, fill #2

## 2022-07-14 ENCOUNTER — Other Ambulatory Visit (HOSPITAL_COMMUNITY): Payer: Self-pay

## 2022-07-28 ENCOUNTER — Other Ambulatory Visit (HOSPITAL_COMMUNITY): Payer: Self-pay

## 2022-07-28 DIAGNOSIS — H524 Presbyopia: Secondary | ICD-10-CM | POA: Diagnosis not present

## 2022-08-03 ENCOUNTER — Other Ambulatory Visit (HOSPITAL_COMMUNITY): Payer: Self-pay

## 2022-08-05 DIAGNOSIS — G4733 Obstructive sleep apnea (adult) (pediatric): Secondary | ICD-10-CM | POA: Diagnosis not present

## 2022-08-09 ENCOUNTER — Other Ambulatory Visit (HOSPITAL_COMMUNITY): Payer: Self-pay

## 2022-08-09 DIAGNOSIS — H35342 Macular cyst, hole, or pseudohole, left eye: Secondary | ICD-10-CM | POA: Diagnosis not present

## 2022-08-09 DIAGNOSIS — H2513 Age-related nuclear cataract, bilateral: Secondary | ICD-10-CM | POA: Diagnosis not present

## 2022-08-09 DIAGNOSIS — H43813 Vitreous degeneration, bilateral: Secondary | ICD-10-CM | POA: Diagnosis not present

## 2022-08-12 ENCOUNTER — Other Ambulatory Visit (HOSPITAL_COMMUNITY): Payer: Self-pay

## 2022-08-14 ENCOUNTER — Other Ambulatory Visit (HOSPITAL_COMMUNITY): Payer: Self-pay

## 2022-08-15 ENCOUNTER — Other Ambulatory Visit (HOSPITAL_COMMUNITY): Payer: Self-pay

## 2022-08-15 MED ORDER — METFORMIN HCL 500 MG PO TABS
500.0000 mg | ORAL_TABLET | Freq: Every day | ORAL | 1 refills | Status: DC
  Filled 2022-08-15: qty 90, 90d supply, fill #0
  Filled 2022-11-18: qty 90, 90d supply, fill #1

## 2022-08-29 ENCOUNTER — Other Ambulatory Visit (HOSPITAL_COMMUNITY): Payer: Self-pay

## 2022-08-29 MED ORDER — CHLORTHALIDONE 25 MG PO TABS
12.5000 mg | ORAL_TABLET | Freq: Every morning | ORAL | 1 refills | Status: AC
  Filled 2022-08-29: qty 45, 90d supply, fill #0
  Filled 2022-11-07 – 2022-11-18 (×2): qty 45, 90d supply, fill #1

## 2022-09-09 ENCOUNTER — Other Ambulatory Visit (HOSPITAL_BASED_OUTPATIENT_CLINIC_OR_DEPARTMENT_OTHER): Payer: Self-pay

## 2022-09-09 ENCOUNTER — Other Ambulatory Visit (HOSPITAL_COMMUNITY): Payer: Self-pay

## 2022-09-12 ENCOUNTER — Other Ambulatory Visit (HOSPITAL_COMMUNITY): Payer: Self-pay

## 2022-09-12 ENCOUNTER — Encounter (HOSPITAL_BASED_OUTPATIENT_CLINIC_OR_DEPARTMENT_OTHER): Payer: Self-pay | Admitting: Pediatrics

## 2022-09-12 ENCOUNTER — Emergency Department (HOSPITAL_BASED_OUTPATIENT_CLINIC_OR_DEPARTMENT_OTHER)
Admission: EM | Admit: 2022-09-12 | Discharge: 2022-09-12 | Disposition: A | Discharge status: Home / Self Care | Payer: 59 | Attending: Emergency Medicine | Admitting: Emergency Medicine

## 2022-09-12 ENCOUNTER — Other Ambulatory Visit (HOSPITAL_BASED_OUTPATIENT_CLINIC_OR_DEPARTMENT_OTHER): Payer: Self-pay

## 2022-09-12 ENCOUNTER — Other Ambulatory Visit: Payer: Self-pay

## 2022-09-12 DIAGNOSIS — M25561 Pain in right knee: Secondary | ICD-10-CM | POA: Diagnosis not present

## 2022-09-12 DIAGNOSIS — Z7984 Long term (current) use of oral hypoglycemic drugs: Secondary | ICD-10-CM | POA: Insufficient documentation

## 2022-09-12 DIAGNOSIS — Z79899 Other long term (current) drug therapy: Secondary | ICD-10-CM | POA: Diagnosis not present

## 2022-09-12 DIAGNOSIS — M5442 Lumbago with sciatica, left side: Secondary | ICD-10-CM | POA: Insufficient documentation

## 2022-09-12 DIAGNOSIS — M545 Low back pain, unspecified: Secondary | ICD-10-CM | POA: Diagnosis present

## 2022-09-12 DIAGNOSIS — Z91040 Latex allergy status: Secondary | ICD-10-CM | POA: Diagnosis not present

## 2022-09-12 MED ORDER — KETOROLAC TROMETHAMINE 15 MG/ML IJ SOLN
15.0000 mg | Freq: Once | INTRAMUSCULAR | Status: AC
  Administered 2022-09-12: 15 mg via INTRAMUSCULAR
  Filled 2022-09-12: qty 1

## 2022-09-12 MED ORDER — METHYLPREDNISOLONE 4 MG PO TBPK
ORAL_TABLET | ORAL | 0 refills | Status: AC
  Filled 2022-09-12: qty 21, 6d supply, fill #0

## 2022-09-12 MED ORDER — ROSUVASTATIN CALCIUM 5 MG PO TABS
5.0000 mg | ORAL_TABLET | Freq: Every day | ORAL | 1 refills | Status: DC
  Filled 2022-09-12: qty 90, 90d supply, fill #0
  Filled 2022-12-09: qty 90, 90d supply, fill #1

## 2022-09-12 MED ORDER — PANTOPRAZOLE SODIUM 40 MG PO TBEC
40.0000 mg | DELAYED_RELEASE_TABLET | Freq: Every morning | ORAL | 0 refills | Status: DC
  Filled 2022-09-12: qty 90, 90d supply, fill #0

## 2022-09-12 MED ORDER — METOPROLOL SUCCINATE ER 100 MG PO TB24
150.0000 mg | ORAL_TABLET | Freq: Every day | ORAL | 1 refills | Status: DC
  Filled 2022-09-12: qty 135, 90d supply, fill #0
  Filled 2022-12-28: qty 135, 90d supply, fill #1

## 2022-09-12 MED ORDER — ACETAMINOPHEN 500 MG PO TABS
1000.0000 mg | ORAL_TABLET | Freq: Once | ORAL | Status: AC
  Administered 2022-09-12: 1000 mg via ORAL
  Filled 2022-09-12: qty 2

## 2022-09-12 NOTE — Discharge Instructions (Signed)
Your back pain is most likely due to a muscular strain.  There is been a lot of research on back pain, unfortunately the only thing that seems to really help is Tylenol and ibuprofen.  Relative rest is also important to not lift greater than 10 pounds bending or twisting at the waist.  Please follow-up with your family physician.  The other thing that really seems to benefit patients is physical therapy which your doctor may send you for.  Please return to the emergency department for new numbness or weakness to your arms or legs. Difficulty with urinating or urinating or pooping on yourself.  Also if you cannot feel toilet paper when you wipe or get a fever.   Use the steroids as prescribed.   Also take tylenol '1000mg'$ (2 extra strength) four times a day.   You can also contact your neurosurgeon if you would like I am sure he would be happy to see your for this.  Personally I found stretching helpful.  Below is a link for a YouTube video you can try. [email or web address]

## 2022-09-12 NOTE — ED Triage Notes (Signed)
C/O low back pain on the left side goes down the leg x 2 days; no recent injury. Stated takes aleve, no relief.

## 2022-09-12 NOTE — ED Provider Notes (Signed)
[place] EMERGENCY DEPARTMENT Provider Note   CSN: [id number] Arrival date & time: 09/12/22  0932     History  Chief Complaint  Patient presents with   Back Pain    Whitney [name] is a 66 y.o. female.  66 yo F with a chief complaints of left-sided low back pain that radiates down the leg.  This been going on for the past couple days.  Has been trying Aleve without improvement.  She denies trauma denies urinary symptoms denies loss of bowel or bladder denies loss of rectal sensation denies numbness or weakness to the leg.  She denies fevers denies history of cancer denies abdominal pain.  Pain is worse with movement palpation and twisting.   Back Pain      Home Medications Prior to Admission medications   Medication Sig Start Date End Date Taking? Authorizing Provider  methylPREDNISolone (MEDROL DOSEPAK) 4 MG TBPK tablet Day 1: 51m before breakfast, 4 mg after lunch, 4 mg after supper, and 8 mg at bedtime  Day 2: 4 mg before breakfast, 4 mg after lunch, 4 mg  after supper, and 8 mg  at bedtime Day 3:  4 mg  before breakfast, 4 mg  after lunch, 4 mg after supper, and 4 mg  at bedtime Day 4: 4 mg  before breakfast, 4 mg  after lunch, and 4 mg at bedtime Day 5: 4 mg  before breakfast and 4 mg at bedtime Day 6: 4 mg  before breakfast 09/12/22  Yes FDeno Etienne DO  albuterol (VENTOLIN HFA) 108 (90 Base) MCG/ACT inhaler Inhale 2 puffs into the lungs every 4 (four) hours. 02/17/21     amLODipine (NORVASC) 5 MG tablet Take 5 mg by mouth at bedtime. Patient not taking: Reported on 04/07/2021    [provider]  amLODipine (NORVASC) 5 MG tablet TAKE 1 TABLET BY MOUTH ONCE A DAY Patient not taking: Reported on 04/07/2021 07/31/20 07/31/21  MKathyrn Lass MD  amLODipine (NORVASC) 5 MG tablet Take 1 tablet (5 mg total) by mouth daily. 05/18/22     azithromycin (ZITHROMAX) 250 MG tablet Take 2 tablets by mouth on day 1, then take 1 tablet by mouth daily 09/17/21      chlorthalidone (HYGROTON) 25 MG tablet Take 25 mg by mouth daily.    [provider]  chlorthalidone (HYGROTON) 25 MG tablet Take 1 tablet (25 mg total) by mouth in the morning with food. 01/01/21   WMarda Stalker PA-C  chlorthalidone (HYGROTON) 25 MG tablet TAKE 1 TABLET BY MOUTH ONCE DAILY EVERY MORNING WITH FOOD Patient not taking: Reported on 04/07/2021 09/09/20 09/09/21  MCari Caraway MD  chlorthalidone (HYGROTON) 25 MG tablet TAKE 1 TABLET BY MOUTH IN THE MORNING WITH FOOD 08/11/20 08/11/21  MCari Caraway MD  chlorthalidone (HYGROTON) 25 MG tablet Take 0.5 tablets (12.5 mg total) by mouth in the morning with food 06/07/21     chlorthalidone (HYGROTON) 25 MG tablet Take 0.5 tablets (12.5 mg total) by mouth in the morning with food 08/29/22     citalopram (CELEXA) 20 MG tablet Take 10 mg by mouth at bedtime. Take 1/2 tablet daily at bedtime. Patient not taking: Reported on 04/07/2021    [provider]  [facility] KIT Use as Directed 06/07/21   FJefm Bryant RPH  doxycycline (VIBRA-TABS) 100 MG tablet Take 1 tablet (100 mg total) by mouth 2 (two) times daily for 7 days Patient not taking: Reported  on 04/07/2021 03/01/21   Fatima Blank, MD  famotidine (PEPCID) 20 MG tablet TAKE 1 TABLET BY MOUTH ONCE DAILY AT BEDTIME 09/02/14   Tanda Rockers, MD  famotidine (PEPCID) 20 MG tablet TAKE 1 TABLET BY MOUTH BEFORE BEDTIME 12/01/20 12/01/21  Cari Caraway, MD  famotidine (PEPCID) 20 MG tablet TAKE 1 TABLET BY MOUTH BEFORE BEDTIME 05/14/20 05/14/21  Cari Caraway, MD  famotidine (PEPCID) 20 MG tablet Take 1 tablet (20 mg total) by mouth at bedtime. 01/20/22     metFORMIN (GLUCOPHAGE) 500 MG tablet TAKE 1 TABLET BY MOUTH ONCE A DAY WITH A MEAL 01/01/21 01/01/22  Marda Stalker, PA-C  metFORMIN (GLUCOPHAGE) 500 MG tablet Take 1 tablet (500 mg total) by mouth daily with a meal 08/15/22     metoprolol succinate (TOPROL-XL) 100 MG 24 hr  tablet One half in am and one half pm Patient not taking: Reported on 04/07/2021 05/21/13   Tanda Rockers, MD  metoprolol succinate (TOPROL-XL) 100 MG 24 hr tablet TAKE 1 & 1/2 TABLETS BY MOUTH ONCE A DAY 01/01/21 01/01/22  Marda Stalker, PA-C  metoprolol succinate (TOPROL-XL) 100 MG 24 hr tablet TAKE 1 & 1/2 TABLETS BY MOUTH ONCE DAILY. Patient not taking: Reported on 04/07/2021 12/14/20 12/14/21  Cari Caraway, MD  metoprolol succinate (TOPROL-XL) 100 MG 24 hr tablet TAKE 1& 1/2 TABLETS BY MOUTH ONCE A DAY Patient not taking: Reported on 04/07/2021 05/14/20 05/14/21  Cari Caraway, MD  metoprolol succinate (TOPROL-XL) 100 MG 24 hr tablet Take 1.5 tablets (150 mg total) by mouth daily. 09/12/22     pantoprazole (PROTONIX) 40 MG tablet Take 1 tablet (40 mg total) by mouth daily. Take 30-60 min before first meal of the day 09/17/14   Tanda Rockers, MD  pantoprazole (PROTONIX) 40 MG tablet TAKE 1 TABLET BY MOUTH DAILY IN THE MORNING 01/01/21 01/01/22  Marda Stalker, PA-C  pantoprazole (PROTONIX) 40 MG tablet TAKE 1 TABLET BY MOUTH DAILY IN THE MORNING Patient not taking: Reported on 04/07/2021 05/14/20 05/14/21  Cari Caraway, MD  pantoprazole (PROTONIX) 40 MG tablet TAKE 1 TABLET BY MOUTH DAILY IN THE MORNING Patient not taking: Reported on 04/07/2021 11/23/20 11/23/21  Cari Caraway, MD  pantoprazole (PROTONIX) 40 MG tablet Take 1 tablet (40 mg total) by mouth every morning. Patient not taking: Reported on 04/07/2021 03/04/21     pantoprazole (PROTONIX) 40 MG tablet Take 1 tablet (40 mg total) by mouth every morning. 09/08/21     pantoprazole (PROTONIX) 40 MG tablet Take 1 tablet (40 mg total) by mouth in the morning. 09/12/22     potassium chloride (KLOR-CON) 20 MEQ packet Take 20 mEq by mouth 2 (two) times daily. Patient not taking: Reported on 04/07/2021    [provider]  potassium chloride SA (KLOR-CON M) 20 MEQ tablet Take 1 tablet (20 mEq total) by mouth daily. 11/12/21     potassium chloride SA  (KLOR-CON M) 20 MEQ tablet Take 1 tablet (20 mEq total) by mouth daily. 05/23/22     potassium chloride SA (KLOR-CON) 20 MEQ tablet TAKE 1 TABLET BY MOUTH ONCE DAILY WITH FOOD Patient taking differently: Take by mouth daily. with food 09/09/20 09/09/21  Cari Caraway, MD  potassium chloride SA (KLOR-CON) 20 MEQ tablet TAKE 1 TABLET BY MOUTH DAILY Patient not taking: Reported on 04/07/2021 05/14/20 05/14/21  Cari Caraway, MD  promethazine (PHENERGAN) 12.5 MG tablet Take 1 tablet (12.5 mg total) by mouth every 6 (six) hours as needed for nausea or vomiting.  Patient not taking: Reported on 04/07/2021 04/19/19   Shepperson, Kirstin, PA-C  rosuvastatin (CRESTOR) 5 MG tablet Take 1 tablet (5 mg total) by mouth daily. 09/12/22     Semaglutide, 1 MG/DOSE, (OZEMPIC, 1 MG/DOSE,) 4 MG/3ML SOPN Inject 1 mg into the skin once a week. 12/13/21     Semaglutide, 2 MG/DOSE, (OZEMPIC, 2 MG/DOSE,) 8 MG/3ML SOPN Inject 2 mg into the skin once a week. 07/13/22     Semaglutide,0.25 or 0.5MG/DOS, (OZEMPIC, 0.25 OR 0.5 MG/DOSE,) 2 MG/1.5ML SOPN Inject 0.25 mg into the skin once a week. 07/21/21     Semaglutide,0.25 or 0.5MG/DOS, (OZEMPIC, 0.25 OR 0.5 MG/DOSE,) 2 MG/1.5ML SOPN Inject 0.5 mg into the skin once a week. 08/26/21     venlafaxine XR (EFFEXOR-XR) 37.5 MG 24 hr capsule TAKE 1 CAPSULE BY MOUTH ONCE DAILY. Patient not taking: Reported on 04/07/2021 01/22/21 01/22/22  Chrzanowski, Wende Crease B, NP  venlafaxine XR (EFFEXOR-XR) 37.5 MG 24 hr capsule TAKE 1 CAPSULE BY MOUTH ONCE DAILY. Patient not taking: Reported on 04/07/2021 01/11/21 01/11/22  Chrzanowski, Wende Crease B, NP  venlafaxine XR (EFFEXOR-XR) 37.5 MG 24 hr capsule TAKE 1 CAPSULE BY MOUTH ONCE DAILY. 01/22/20 01/21/21  Chrzanowski, Wende Crease B, NP  venlafaxine XR (EFFEXOR-XR) 37.5 MG 24 hr capsule TAKE 1 CAPSULE BY MOUTH ONCE DAILY. 02/11/21     venlafaxine XR (EFFEXOR-XR) 37.5 MG 24 hr capsule Take 1 capsule (37.5 mg total) by mouth daily. 05/18/22         Allergies    Latex    Review of  Systems   Review of Systems  Musculoskeletal:  Positive for back pain.    Physical Exam Updated Vital Signs BP 131/85 (BP Location: Right Arm)   Pulse 81   Temp 97.8 F (36.6 C) (Oral)   Resp 18   Ht _0  (1.702 m)   Wt 72.6 kg   SpO2 100%   BMI 25.06 kg/m  Physical Exam Vitals and nursing note reviewed.  Constitutional:      General: She is not in acute distress.    Appearance: She is well-developed. She is not diaphoretic.  HENT:     Head: Normocephalic and atraumatic.  Eyes:     Pupils: Pupils are equal, round, and reactive to light.  Cardiovascular:     Rate and Rhythm: Normal rate and regular rhythm.     Heart sounds: No murmur heard.    No friction rub. No gallop.  Pulmonary:     Effort: Pulmonary effort is normal.     Breath sounds: No wheezing or rales.  Abdominal:     General: There is no distension.     Palpations: Abdomen is soft.     Tenderness: There is no abdominal tenderness.  Musculoskeletal:        General: Tenderness present.     Cervical back: Normal range of motion and neck supple.     Comments: No obvious midline spinal tenderness step-offs or deformities.  Pain to the left low back.  Pulse motor and sensation intact to the left lower extremity.  Reflexes are 2+ and equal.  No clonus.  Negative straight leg raise test.  Skin:    General: Skin is warm and dry.  Neurological:     Mental Status: She is alert and oriented to person, place, and time.  Psychiatric:        Behavior: Behavior normal.     ED Results / Procedures / Treatments   Labs (all labs ordered are listed, but  only abnormal results are displayed) Labs Reviewed - No data to display  EKG None  Radiology No results found.  Procedures Procedures    Medications Ordered in ED Medications  ketorolac (TORADOL) 15 MG/ML injection 15 mg (has no administration in time range)  acetaminophen (TYLENOL) tablet 1,000 mg (has no administration in time range)    ED Course/  Medical Decision Making/ A&P                           Medical Decision Making Risk OTC drugs. Prescription drug management.   66 yo F with a chief complaints of left-sided low back pain.  Going on for about 48 hours.  Started after she had been putting up Christmas decorations.  No red flags.  Benign exam.  Will treat supportively.  PCP follow-up.  9:57 AM:  I have discussed the diagnosis/risks/treatment options with the patient.  Evaluation and diagnostic testing in the emergency department does not suggest an emergent condition requiring admission or immediate intervention beyond what has been performed at this time.  They will follow up with PCP. We also discussed returning to the ED immediately if new or worsening sx occur. We discussed the sx which are most concerning (e.g., sudden worsening pain, fever, inability to tolerate by mouth, cauda equina s/sx) that necessitate immediate return. Medications administered to the patient during their visit and any new prescriptions provided to the patient are listed below.  Medications given during this visit Medications  ketorolac (TORADOL) 15 MG/ML injection 15 mg (has no administration in time range)  acetaminophen (TYLENOL) tablet 1,000 mg (has no administration in time range)     The patient appears reasonably screen and/or stabilized for discharge and I doubt any other medical condition or other [facility] requiring further screening, evaluation, or treatment in the ED at this time prior to discharge.          Final Clinical Impression(s) / ED Diagnoses Final diagnoses:  Acute left-sided low back pain with left-sided sciatica    Rx / DC Orders ED Discharge Orders          Ordered    methylPREDNISolone (MEDROL DOSEPAK) 4 MG TBPK tablet        09/12/22 [place], [place], DO 09/12/22 0957

## 2022-09-19 DIAGNOSIS — H25811 Combined forms of age-related cataract, right eye: Secondary | ICD-10-CM | POA: Diagnosis not present

## 2022-09-19 DIAGNOSIS — H43811 Vitreous degeneration, right eye: Secondary | ICD-10-CM | POA: Diagnosis not present

## 2022-09-19 DIAGNOSIS — H2512 Age-related nuclear cataract, left eye: Secondary | ICD-10-CM | POA: Diagnosis not present

## 2022-09-19 DIAGNOSIS — H35342 Macular cyst, hole, or pseudohole, left eye: Secondary | ICD-10-CM | POA: Diagnosis not present

## 2022-10-06 DIAGNOSIS — H35342 Macular cyst, hole, or pseudohole, left eye: Secondary | ICD-10-CM | POA: Diagnosis not present

## 2022-10-06 DIAGNOSIS — H2512 Age-related nuclear cataract, left eye: Secondary | ICD-10-CM | POA: Diagnosis not present

## 2022-10-07 DIAGNOSIS — H35341 Macular cyst, hole, or pseudohole, right eye: Secondary | ICD-10-CM | POA: Diagnosis not present

## 2022-10-07 DIAGNOSIS — Z9889 Other specified postprocedural states: Secondary | ICD-10-CM | POA: Diagnosis not present

## 2022-10-10 ENCOUNTER — Other Ambulatory Visit (HOSPITAL_COMMUNITY): Payer: Self-pay

## 2022-10-10 MED ORDER — OZEMPIC (2 MG/DOSE) 8 MG/3ML ~~LOC~~ SOPN
2.0000 mg | PEN_INJECTOR | SUBCUTANEOUS | 0 refills | Status: DC
Start: 2022-10-10 — End: 2022-11-01
  Filled 2022-10-10: qty 3, 28d supply, fill #0

## 2022-10-18 DIAGNOSIS — H35342 Macular cyst, hole, or pseudohole, left eye: Secondary | ICD-10-CM | POA: Diagnosis not present

## 2022-10-18 DIAGNOSIS — Z9889 Other specified postprocedural states: Secondary | ICD-10-CM | POA: Diagnosis not present

## 2022-10-19 DIAGNOSIS — D225 Melanocytic nevi of trunk: Secondary | ICD-10-CM | POA: Diagnosis not present

## 2022-10-19 DIAGNOSIS — L819 Disorder of pigmentation, unspecified: Secondary | ICD-10-CM | POA: Diagnosis not present

## 2022-10-19 DIAGNOSIS — D2272 Melanocytic nevi of left lower limb, including hip: Secondary | ICD-10-CM | POA: Diagnosis not present

## 2022-10-19 DIAGNOSIS — L821 Other seborrheic keratosis: Secondary | ICD-10-CM | POA: Diagnosis not present

## 2022-10-19 DIAGNOSIS — Z85828 Personal history of other malignant neoplasm of skin: Secondary | ICD-10-CM | POA: Diagnosis not present

## 2022-10-20 ENCOUNTER — Other Ambulatory Visit (HOSPITAL_COMMUNITY): Payer: Self-pay

## 2022-10-20 ENCOUNTER — Other Ambulatory Visit (HOSPITAL_COMMUNITY): Payer: Self-pay | Admitting: Neurological Surgery

## 2022-10-20 DIAGNOSIS — M5412 Radiculopathy, cervical region: Secondary | ICD-10-CM | POA: Diagnosis not present

## 2022-10-20 DIAGNOSIS — M5416 Radiculopathy, lumbar region: Secondary | ICD-10-CM | POA: Diagnosis not present

## 2022-10-20 DIAGNOSIS — Z6825 Body mass index (BMI) 25.0-25.9, adult: Secondary | ICD-10-CM | POA: Diagnosis not present

## 2022-10-20 MED ORDER — TIZANIDINE HCL 2 MG PO TABS
2.0000 mg | ORAL_TABLET | Freq: Three times a day (TID) | ORAL | 1 refills | Status: AC | PRN
  Filled 2022-10-20: qty 30, 10d supply, fill #0

## 2022-10-20 MED ORDER — OXYCODONE-ACETAMINOPHEN 5-325 MG PO TABS
1.0000 | ORAL_TABLET | Freq: Three times a day (TID) | ORAL | 0 refills | Status: AC | PRN
  Filled 2022-10-20: qty 20, 7d supply, fill #0

## 2022-10-21 ENCOUNTER — Ambulatory Visit (HOSPITAL_COMMUNITY)
Admission: RE | Admit: 2022-10-21 | Discharge: 2022-10-21 | Disposition: A | Discharge status: Home / Self Care | Payer: 59 | Source: Ambulatory Visit | Attending: Neurological Surgery | Admitting: Neurological Surgery

## 2022-10-21 DIAGNOSIS — M5416 Radiculopathy, lumbar region: Secondary | ICD-10-CM | POA: Diagnosis not present

## 2022-10-21 DIAGNOSIS — M5126 Other intervertebral disc displacement, lumbar region: Secondary | ICD-10-CM | POA: Diagnosis not present

## 2022-10-21 DIAGNOSIS — M48061 Spinal stenosis, lumbar region without neurogenic claudication: Secondary | ICD-10-CM | POA: Diagnosis not present

## 2022-10-26 ENCOUNTER — Other Ambulatory Visit (HOSPITAL_COMMUNITY): Payer: Self-pay

## 2022-10-26 MED ORDER — AMLODIPINE BESYLATE 5 MG PO TABS
5.0000 mg | ORAL_TABLET | Freq: Every day | ORAL | 0 refills | Status: DC
  Filled 2022-10-26: qty 90, 90d supply, fill #0

## 2022-11-01 ENCOUNTER — Other Ambulatory Visit (HOSPITAL_COMMUNITY): Payer: Self-pay

## 2022-11-01 MED ORDER — OZEMPIC (2 MG/DOSE) 8 MG/3ML ~~LOC~~ SOPN
2.0000 mg | PEN_INJECTOR | SUBCUTANEOUS | 0 refills | Status: DC
  Filled 2022-11-01: qty 3, 28d supply, fill #0

## 2022-11-03 DIAGNOSIS — M5126 Other intervertebral disc displacement, lumbar region: Secondary | ICD-10-CM | POA: Diagnosis not present

## 2022-11-03 DIAGNOSIS — M5416 Radiculopathy, lumbar region: Secondary | ICD-10-CM | POA: Diagnosis not present

## 2022-11-03 DIAGNOSIS — Z6826 Body mass index (BMI) 26.0-26.9, adult: Secondary | ICD-10-CM | POA: Diagnosis not present

## 2022-11-07 ENCOUNTER — Other Ambulatory Visit (HOSPITAL_COMMUNITY): Payer: Self-pay

## 2022-11-08 ENCOUNTER — Other Ambulatory Visit: Payer: Self-pay

## 2022-11-08 ENCOUNTER — Other Ambulatory Visit (HOSPITAL_COMMUNITY): Payer: Self-pay

## 2022-11-14 ENCOUNTER — Other Ambulatory Visit (HOSPITAL_COMMUNITY): Payer: Self-pay

## 2022-11-17 DIAGNOSIS — M5416 Radiculopathy, lumbar region: Secondary | ICD-10-CM | POA: Diagnosis not present

## 2022-11-18 ENCOUNTER — Other Ambulatory Visit (HOSPITAL_COMMUNITY): Payer: Self-pay

## 2022-11-18 DIAGNOSIS — Z9889 Other specified postprocedural states: Secondary | ICD-10-CM | POA: Diagnosis not present

## 2022-11-18 DIAGNOSIS — H35342 Macular cyst, hole, or pseudohole, left eye: Secondary | ICD-10-CM | POA: Diagnosis not present

## 2022-11-18 IMAGING — XA DG INJECT/[PERSON_NAME] INC NEEDLE/CATH/PLC EPI/CERV/THOR W/IMG
2 series · 2 of 2 positions shown · non-contrast
Comparison: none

CLINICAL DATA: Cervical spondylosis without myelopathy. Excellent
response to an epidural injection 1 year ago. Pain has recurred and
involves the right shoulder and arm.

[Series 1: ortho adipose · 1 of 1 slices shown (1 of 2)]
[im 1/1]
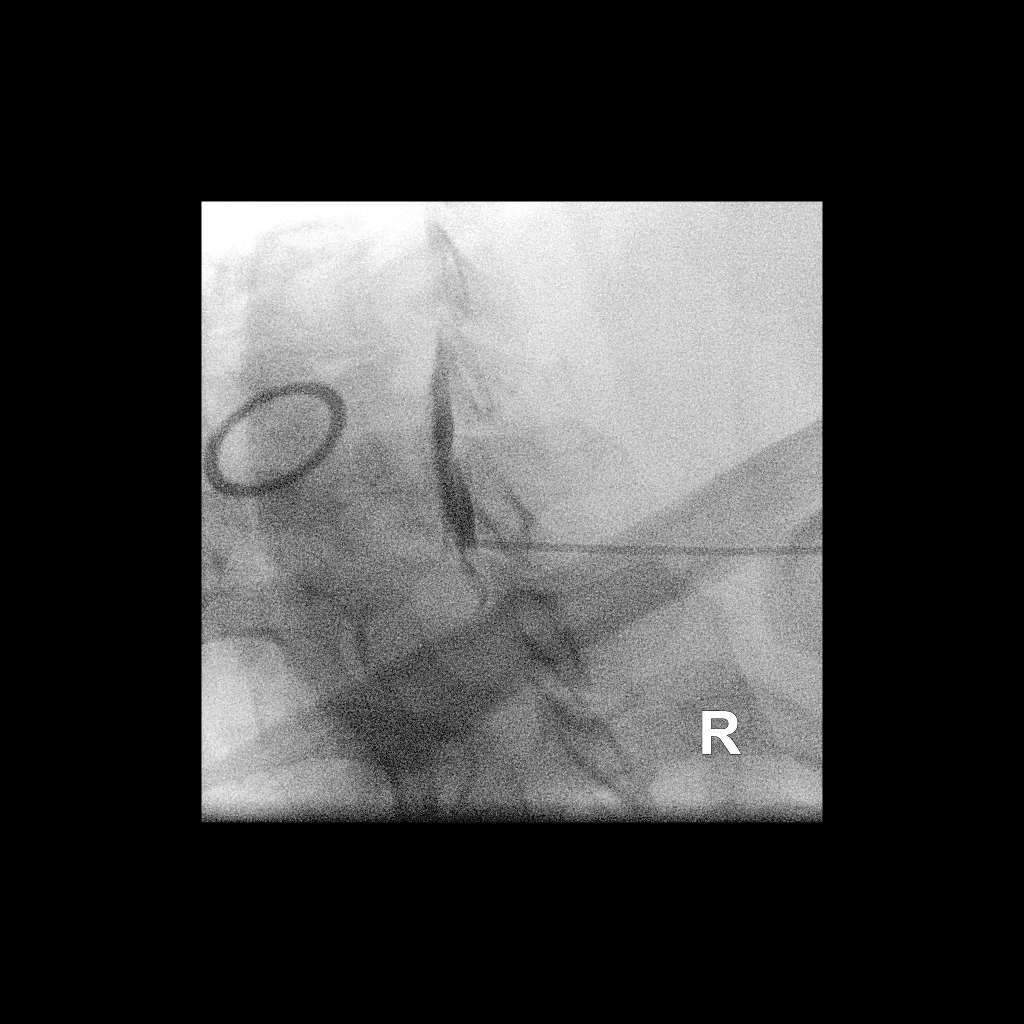

[Series 2: ortho adipose · 1 of 1 slices shown (2 of 2)]
[im 1/1]
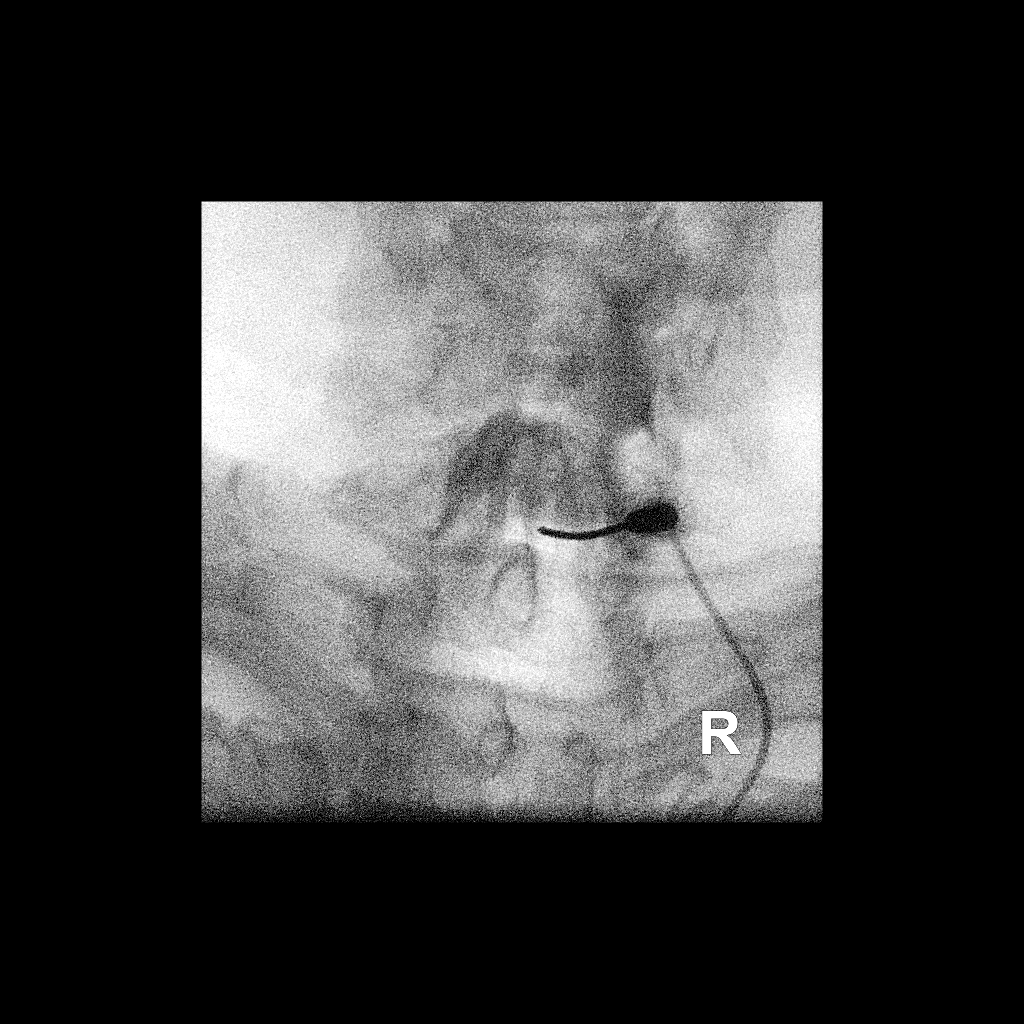

[2 of 2 positions shown; findings below may reference images not displayed]

FLUOROSCOPY TIME:  Fluoroscopy Time: 20 seconds

Radiation Exposure Index: 21.73 microGray*m^2

PROCEDURE:
The procedure, risks, benefits, and alternatives were explained to
the patient. Questions regarding the procedure were encouraged and
answered. The patient understands and consents to the procedure.

CERVICAL EPIDURAL INJECTION

An interlaminar approach was performed on the right at C7-T1. A
inch 20 gauge epidural needle was advanced using loss-of-resistance
technique.

DIAGNOSTIC EPIDURAL INJECTION

Injection of Isovue-M 300 shows a good epidural pattern with spread
above and below the level of needle placement, bilaterally but
greater on the right. No vascular opacification is seen.

THERAPEUTIC EPIDURAL INJECTION

1.5 ml of Kenalog 40 mixed with 2 ml of normal saline were then
instilled. The procedure was well-tolerated, and the patient was
discharged thirty minutes following the injection in good condition.
IMPRESSION: Technically successful interlaminar epidural injection on the right
at C7-T1.

## 2022-11-21 ENCOUNTER — Other Ambulatory Visit (HOSPITAL_COMMUNITY): Payer: Self-pay

## 2022-11-21 MED ORDER — PREVNAR 20 0.5 ML IM SUSY
0.5000 mL | PREFILLED_SYRINGE | INTRAMUSCULAR | 0 refills | Status: AC
  Filled 2022-11-21: qty 0.5, 1d supply, fill #0

## 2022-11-21 MED ORDER — POTASSIUM CHLORIDE CRYS ER 20 MEQ PO TBCR
20.0000 meq | EXTENDED_RELEASE_TABLET | Freq: Every day | ORAL | 0 refills | Status: DC
  Filled 2022-11-21: qty 90, 90d supply, fill #0

## 2022-11-22 ENCOUNTER — Other Ambulatory Visit (HOSPITAL_COMMUNITY): Payer: Self-pay

## 2022-11-29 ENCOUNTER — Other Ambulatory Visit (HOSPITAL_COMMUNITY): Payer: Self-pay

## 2022-11-30 ENCOUNTER — Other Ambulatory Visit (HOSPITAL_COMMUNITY): Payer: Self-pay

## 2022-11-30 MED ORDER — OZEMPIC (2 MG/DOSE) 8 MG/3ML ~~LOC~~ SOPN
2.0000 mg | PEN_INJECTOR | SUBCUTANEOUS | 0 refills | Status: DC
  Filled 2022-11-30: qty 3, 28d supply, fill #0

## 2022-12-08 DIAGNOSIS — M5126 Other intervertebral disc displacement, lumbar region: Secondary | ICD-10-CM | POA: Diagnosis not present

## 2022-12-08 DIAGNOSIS — Z6826 Body mass index (BMI) 26.0-26.9, adult: Secondary | ICD-10-CM | POA: Diagnosis not present

## 2022-12-09 ENCOUNTER — Other Ambulatory Visit (HOSPITAL_COMMUNITY): Payer: Self-pay

## 2022-12-12 ENCOUNTER — Other Ambulatory Visit (HOSPITAL_COMMUNITY): Payer: Self-pay

## 2022-12-12 MED ORDER — PANTOPRAZOLE SODIUM 40 MG PO TBEC
40.0000 mg | DELAYED_RELEASE_TABLET | Freq: Every morning | ORAL | 1 refills | Status: AC
  Filled 2022-12-12: qty 90, 90d supply, fill #0
  Filled 2023-03-08: qty 90, 90d supply, fill #1

## 2022-12-28 ENCOUNTER — Other Ambulatory Visit (HOSPITAL_COMMUNITY): Payer: Self-pay

## 2022-12-28 MED ORDER — OZEMPIC (2 MG/DOSE) 8 MG/3ML ~~LOC~~ SOPN
2.0000 mg | PEN_INJECTOR | SUBCUTANEOUS | 0 refills | Status: DC
Start: 2022-12-28 — End: 2023-01-24
  Filled 2022-12-28: qty 3, 28d supply, fill #0

## 2023-01-24 ENCOUNTER — Other Ambulatory Visit (HOSPITAL_COMMUNITY): Payer: Self-pay

## 2023-01-24 MED ORDER — OZEMPIC (2 MG/DOSE) 8 MG/3ML ~~LOC~~ SOPN
2.0000 mg | PEN_INJECTOR | SUBCUTANEOUS | 0 refills | Status: AC
Start: 2023-01-24 — End: ?
  Filled 2023-01-24: qty 3, 28d supply, fill #0

## 2023-01-25 ENCOUNTER — Other Ambulatory Visit (HOSPITAL_COMMUNITY): Payer: Self-pay

## 2023-01-25 ENCOUNTER — Other Ambulatory Visit (HOSPITAL_COMMUNITY): Payer: Self-pay | Admitting: Family Medicine

## 2023-01-25 DIAGNOSIS — E782 Mixed hyperlipidemia: Secondary | ICD-10-CM

## 2023-01-25 DIAGNOSIS — F411 Generalized anxiety disorder: Secondary | ICD-10-CM | POA: Diagnosis not present

## 2023-01-25 DIAGNOSIS — Z0000 Encounter for general adult medical examination without abnormal findings: Secondary | ICD-10-CM | POA: Diagnosis not present

## 2023-01-25 DIAGNOSIS — Z6825 Body mass index (BMI) 25.0-25.9, adult: Secondary | ICD-10-CM | POA: Diagnosis not present

## 2023-01-25 DIAGNOSIS — K219 Gastro-esophageal reflux disease without esophagitis: Secondary | ICD-10-CM | POA: Diagnosis not present

## 2023-01-25 DIAGNOSIS — G4730 Sleep apnea, unspecified: Secondary | ICD-10-CM | POA: Diagnosis not present

## 2023-01-25 DIAGNOSIS — I10 Essential (primary) hypertension: Secondary | ICD-10-CM | POA: Diagnosis not present

## 2023-01-25 DIAGNOSIS — E1165 Type 2 diabetes mellitus with hyperglycemia: Secondary | ICD-10-CM | POA: Diagnosis not present

## 2023-01-25 MED ORDER — OZEMPIC (2 MG/DOSE) 8 MG/3ML ~~LOC~~ SOPN
2.0000 mg | PEN_INJECTOR | SUBCUTANEOUS | 1 refills | Status: DC
  Filled 2023-01-25: qty 9, 84d supply, fill #0
  Filled 2023-04-21: qty 9, 84d supply, fill #1
  Filled 2023-07-11: qty 6, 56d supply, fill #2

## 2023-01-25 MED ORDER — TRAZODONE HCL 50 MG PO TABS
50.0000 mg | ORAL_TABLET | Freq: Every evening | ORAL | 1 refills | Status: AC
  Filled 2023-01-25: qty 90, 90d supply, fill #0

## 2023-01-25 MED ORDER — ROSUVASTATIN CALCIUM 5 MG PO TABS
5.0000 mg | ORAL_TABLET | Freq: Every day | ORAL | 1 refills | Status: AC
  Filled 2023-01-25 – 2023-03-08 (×2): qty 90, 90d supply, fill #0

## 2023-01-25 MED ORDER — CHLORTHALIDONE 25 MG PO TABS
12.5000 mg | ORAL_TABLET | Freq: Every morning | ORAL | 1 refills | Status: DC
Start: 2023-01-25 — End: 2023-09-18
  Filled 2023-01-25: qty 45, 90d supply, fill #0
  Filled 2023-01-25: qty 45, 30d supply, fill #0
  Filled 2023-01-25: qty 45, 45d supply, fill #0
  Filled 2023-02-07 – 2023-04-10 (×2): qty 45, 90d supply, fill #0
  Filled 2023-06-19 – 2023-06-21 (×2): qty 45, 90d supply, fill #1

## 2023-01-25 MED ORDER — AMLODIPINE BESYLATE 5 MG PO TABS
5.0000 mg | ORAL_TABLET | Freq: Every day | ORAL | 1 refills | Status: AC
  Filled 2023-01-25: qty 90, 90d supply, fill #0
  Filled 2023-04-21: qty 90, 90d supply, fill #1

## 2023-01-25 MED ORDER — METOPROLOL SUCCINATE ER 100 MG PO TB24
150.0000 mg | ORAL_TABLET | Freq: Every day | ORAL | 1 refills | Status: AC
  Filled 2023-01-25: qty 90, 60d supply, fill #0
  Filled 2023-01-25: qty 135, 135d supply, fill #0
  Filled 2023-04-09: qty 135, 90d supply, fill #0
  Filled 2023-07-17: qty 135, 90d supply, fill #1

## 2023-01-25 MED ORDER — VENLAFAXINE HCL ER 37.5 MG PO CP24
37.5000 mg | ORAL_CAPSULE | Freq: Every day | ORAL | 1 refills | Status: DC
  Filled 2023-01-25 – 2023-08-14 (×2): qty 90, 90d supply, fill #0
  Filled 2023-11-10: qty 90, 90d supply, fill #1

## 2023-01-26 ENCOUNTER — Other Ambulatory Visit (HOSPITAL_COMMUNITY): Payer: Self-pay

## 2023-02-03 ENCOUNTER — Other Ambulatory Visit (HOSPITAL_COMMUNITY): Payer: Self-pay

## 2023-02-03 MED ORDER — FAMOTIDINE 20 MG PO TABS
20.0000 mg | ORAL_TABLET | Freq: Every evening | ORAL | 2 refills | Status: DC
  Filled 2023-02-03: qty 90, 90d supply, fill #0
  Filled 2023-05-11: qty 90, 90d supply, fill #1
  Filled 2023-08-20: qty 90, 90d supply, fill #2

## 2023-02-07 ENCOUNTER — Other Ambulatory Visit (HOSPITAL_COMMUNITY): Payer: Self-pay

## 2023-02-12 ENCOUNTER — Other Ambulatory Visit (HOSPITAL_COMMUNITY): Payer: Self-pay

## 2023-02-13 ENCOUNTER — Other Ambulatory Visit (HOSPITAL_COMMUNITY): Payer: Self-pay

## 2023-02-13 MED ORDER — POTASSIUM CHLORIDE CRYS ER 20 MEQ PO TBCR
20.0000 meq | EXTENDED_RELEASE_TABLET | Freq: Every day | ORAL | 1 refills | Status: DC
  Filled 2023-02-13: qty 90, 90d supply, fill #0
  Filled 2023-05-11: qty 90, 90d supply, fill #1

## 2023-02-14 ENCOUNTER — Other Ambulatory Visit (HOSPITAL_COMMUNITY): Payer: Self-pay

## 2023-02-14 MED ORDER — METFORMIN HCL 500 MG PO TABS
500.0000 mg | ORAL_TABLET | Freq: Every day | ORAL | 1 refills | Status: DC
  Filled 2023-02-14: qty 90, 90d supply, fill #0
  Filled 2023-05-23: qty 90, 90d supply, fill #1

## 2023-02-21 ENCOUNTER — Ambulatory Visit (HOSPITAL_COMMUNITY)
Admission: RE | Admit: 2023-02-21 | Discharge: 2023-02-21 | Disposition: A | Discharge status: Home / Self Care | Payer: Commercial Managed Care - PPO | Source: Ambulatory Visit | Attending: Family Medicine | Admitting: Family Medicine

## 2023-02-21 DIAGNOSIS — E782 Mixed hyperlipidemia: Secondary | ICD-10-CM | POA: Insufficient documentation

## 2023-02-21 DIAGNOSIS — I10 Essential (primary) hypertension: Secondary | ICD-10-CM | POA: Insufficient documentation

## 2023-03-08 ENCOUNTER — Other Ambulatory Visit (HOSPITAL_COMMUNITY): Payer: Self-pay

## 2023-03-16 ENCOUNTER — Other Ambulatory Visit (HOSPITAL_COMMUNITY): Payer: Self-pay

## 2023-03-16 MED ORDER — ROSUVASTATIN CALCIUM 20 MG PO TABS
20.0000 mg | ORAL_TABLET | Freq: Every day | ORAL | 1 refills | Status: DC
  Filled 2023-03-16: qty 90, 90d supply, fill #0
  Filled 2023-06-09: qty 90, 90d supply, fill #1

## 2023-04-10 ENCOUNTER — Other Ambulatory Visit: Payer: Self-pay

## 2023-04-10 ENCOUNTER — Other Ambulatory Visit (HOSPITAL_COMMUNITY): Payer: Self-pay

## 2023-04-11 ENCOUNTER — Other Ambulatory Visit (HOSPITAL_COMMUNITY): Payer: Self-pay

## 2023-04-21 ENCOUNTER — Other Ambulatory Visit (HOSPITAL_COMMUNITY): Payer: Self-pay

## 2023-04-28 ENCOUNTER — Other Ambulatory Visit (HOSPITAL_COMMUNITY): Payer: Self-pay

## 2023-04-28 DIAGNOSIS — R112 Nausea with vomiting, unspecified: Secondary | ICD-10-CM | POA: Diagnosis not present

## 2023-04-28 DIAGNOSIS — K5900 Constipation, unspecified: Secondary | ICD-10-CM | POA: Diagnosis not present

## 2023-04-28 MED ORDER — PROMETHAZINE HCL 12.5 MG PO TABS
12.5000 mg | ORAL_TABLET | Freq: Four times a day (QID) | ORAL | 0 refills | Status: AC | PRN
  Filled 2023-04-28: qty 20, 5d supply, fill #0

## 2023-05-05 ENCOUNTER — Other Ambulatory Visit (HOSPITAL_COMMUNITY): Payer: Self-pay

## 2023-05-23 ENCOUNTER — Other Ambulatory Visit (HOSPITAL_COMMUNITY): Payer: Self-pay

## 2023-06-19 ENCOUNTER — Other Ambulatory Visit (HOSPITAL_COMMUNITY): Payer: Self-pay

## 2023-06-20 ENCOUNTER — Other Ambulatory Visit (HOSPITAL_COMMUNITY): Payer: Self-pay

## 2023-06-20 MED ORDER — PANTOPRAZOLE SODIUM 40 MG PO TBEC
40.0000 mg | DELAYED_RELEASE_TABLET | Freq: Every day | ORAL | 3 refills | Status: DC
  Filled 2023-06-20: qty 90, 90d supply, fill #0
  Filled 2023-09-25: qty 90, 90d supply, fill #1
  Filled 2023-12-29: qty 90, 90d supply, fill #2
  Filled 2024-03-18: qty 90, 90d supply, fill #3

## 2023-06-21 ENCOUNTER — Other Ambulatory Visit: Payer: Self-pay

## 2023-06-27 DIAGNOSIS — H43811 Vitreous degeneration, right eye: Secondary | ICD-10-CM | POA: Diagnosis not present

## 2023-06-27 DIAGNOSIS — H25811 Combined forms of age-related cataract, right eye: Secondary | ICD-10-CM | POA: Diagnosis not present

## 2023-06-27 DIAGNOSIS — Z961 Presence of intraocular lens: Secondary | ICD-10-CM | POA: Diagnosis not present

## 2023-06-27 DIAGNOSIS — H35342 Macular cyst, hole, or pseudohole, left eye: Secondary | ICD-10-CM | POA: Diagnosis not present

## 2023-07-12 ENCOUNTER — Other Ambulatory Visit (HOSPITAL_COMMUNITY): Payer: Self-pay

## 2023-07-17 ENCOUNTER — Other Ambulatory Visit (HOSPITAL_COMMUNITY): Payer: Self-pay

## 2023-08-02 ENCOUNTER — Other Ambulatory Visit (HOSPITAL_COMMUNITY): Payer: Self-pay

## 2023-08-02 DIAGNOSIS — E782 Mixed hyperlipidemia: Secondary | ICD-10-CM | POA: Diagnosis not present

## 2023-08-02 DIAGNOSIS — Z79899 Other long term (current) drug therapy: Secondary | ICD-10-CM | POA: Diagnosis not present

## 2023-08-02 DIAGNOSIS — I10 Essential (primary) hypertension: Secondary | ICD-10-CM | POA: Diagnosis not present

## 2023-08-02 DIAGNOSIS — K219 Gastro-esophageal reflux disease without esophagitis: Secondary | ICD-10-CM | POA: Diagnosis not present

## 2023-08-02 DIAGNOSIS — F411 Generalized anxiety disorder: Secondary | ICD-10-CM | POA: Diagnosis not present

## 2023-08-02 DIAGNOSIS — R112 Nausea with vomiting, unspecified: Secondary | ICD-10-CM | POA: Diagnosis not present

## 2023-08-02 DIAGNOSIS — E1165 Type 2 diabetes mellitus with hyperglycemia: Secondary | ICD-10-CM | POA: Diagnosis not present

## 2023-08-02 DIAGNOSIS — Z6825 Body mass index (BMI) 25.0-25.9, adult: Secondary | ICD-10-CM | POA: Diagnosis not present

## 2023-08-02 MED ORDER — PROMETHAZINE HCL 12.5 MG PO TABS
12.5000 mg | ORAL_TABLET | Freq: Four times a day (QID) | ORAL | 1 refills | Status: AC | PRN
  Filled 2023-08-02: qty 20, 5d supply, fill #0

## 2023-08-06 ENCOUNTER — Other Ambulatory Visit (HOSPITAL_COMMUNITY): Payer: Self-pay

## 2023-08-07 ENCOUNTER — Other Ambulatory Visit: Payer: Self-pay

## 2023-08-07 ENCOUNTER — Encounter (HOSPITAL_COMMUNITY): Payer: Self-pay

## 2023-08-07 ENCOUNTER — Other Ambulatory Visit (HOSPITAL_COMMUNITY): Payer: Self-pay

## 2023-08-07 MED ORDER — AMLODIPINE BESYLATE 5 MG PO TABS
5.0000 mg | ORAL_TABLET | Freq: Every day | ORAL | 1 refills | Status: DC
  Filled 2023-08-07: qty 90, 90d supply, fill #0
  Filled 2023-11-03: qty 90, 90d supply, fill #1

## 2023-08-07 MED ORDER — POTASSIUM CHLORIDE CRYS ER 20 MEQ PO TBCR
20.0000 meq | EXTENDED_RELEASE_TABLET | Freq: Every day | ORAL | 1 refills | Status: DC
  Filled 2023-08-07: qty 90, 90d supply, fill #0
  Filled 2023-11-10: qty 90, 90d supply, fill #1

## 2023-08-08 ENCOUNTER — Other Ambulatory Visit (HOSPITAL_COMMUNITY): Payer: Self-pay

## 2023-08-11 ENCOUNTER — Other Ambulatory Visit (HOSPITAL_COMMUNITY): Payer: Self-pay

## 2023-08-14 ENCOUNTER — Other Ambulatory Visit (HOSPITAL_COMMUNITY): Payer: Self-pay

## 2023-08-16 ENCOUNTER — Other Ambulatory Visit (HOSPITAL_COMMUNITY): Payer: Self-pay

## 2023-08-18 ENCOUNTER — Other Ambulatory Visit (HOSPITAL_COMMUNITY): Payer: Self-pay

## 2023-08-20 ENCOUNTER — Other Ambulatory Visit (HOSPITAL_COMMUNITY): Payer: Self-pay

## 2023-08-21 ENCOUNTER — Other Ambulatory Visit (HOSPITAL_COMMUNITY): Payer: Self-pay

## 2023-08-21 MED ORDER — METFORMIN HCL 500 MG PO TABS
500.0000 mg | ORAL_TABLET | Freq: Every day | ORAL | 1 refills | Status: DC
  Filled 2023-08-21: qty 90, 90d supply, fill #0
  Filled 2023-11-10: qty 90, 90d supply, fill #1

## 2023-08-22 ENCOUNTER — Other Ambulatory Visit (HOSPITAL_COMMUNITY): Payer: Self-pay

## 2023-08-29 ENCOUNTER — Other Ambulatory Visit (HOSPITAL_COMMUNITY): Payer: Self-pay

## 2023-09-07 ENCOUNTER — Other Ambulatory Visit (HOSPITAL_COMMUNITY): Payer: Self-pay

## 2023-09-07 MED ORDER — ROSUVASTATIN CALCIUM 20 MG PO TABS
20.0000 mg | ORAL_TABLET | Freq: Every day | ORAL | 3 refills | Status: AC
  Filled 2023-09-07: qty 90, 90d supply, fill #0
  Filled 2023-12-06: qty 90, 90d supply, fill #1
  Filled 2024-03-08: qty 90, 90d supply, fill #2

## 2023-09-07 MED ORDER — CHLORTHALIDONE 25 MG PO TABS
12.5000 mg | ORAL_TABLET | Freq: Every morning | ORAL | 1 refills | Status: DC
  Filled 2023-09-07: qty 45, 90d supply, fill #0
  Filled 2023-11-26 – 2023-12-06 (×2): qty 45, 90d supply, fill #1

## 2023-09-08 ENCOUNTER — Other Ambulatory Visit (HOSPITAL_COMMUNITY): Payer: Self-pay

## 2023-09-14 ENCOUNTER — Other Ambulatory Visit (HOSPITAL_COMMUNITY): Payer: Self-pay

## 2023-09-18 ENCOUNTER — Other Ambulatory Visit (HOSPITAL_COMMUNITY): Payer: Self-pay

## 2023-09-18 MED ORDER — CHLORTHALIDONE 25 MG PO TABS
12.5000 mg | ORAL_TABLET | Freq: Every morning | ORAL | 1 refills | Status: AC
  Filled 2023-09-18: qty 45, 90d supply, fill #0

## 2023-09-18 MED ORDER — ROSUVASTATIN CALCIUM 20 MG PO TABS
20.0000 mg | ORAL_TABLET | Freq: Every day | ORAL | 3 refills | Status: AC
  Filled 2023-09-18 – 2024-06-07 (×2): qty 90, 90d supply, fill #0
  Filled 2024-09-11: qty 90, 90d supply, fill #1

## 2023-09-18 MED ORDER — OZEMPIC (2 MG/DOSE) 8 MG/3ML ~~LOC~~ SOPN
2.0000 mg | PEN_INJECTOR | SUBCUTANEOUS | 1 refills | Status: DC
  Filled 2023-09-18: qty 9, 84d supply, fill #0
  Filled 2023-11-22 – 2023-11-29 (×2): qty 9, 84d supply, fill #1

## 2023-10-11 ENCOUNTER — Other Ambulatory Visit (HOSPITAL_COMMUNITY): Payer: Self-pay

## 2023-10-11 MED ORDER — METOPROLOL SUCCINATE ER 100 MG PO TB24
150.0000 mg | ORAL_TABLET | Freq: Every day | ORAL | 0 refills | Status: DC
  Filled 2023-10-11: qty 135, 90d supply, fill #0

## 2023-10-27 ENCOUNTER — Other Ambulatory Visit (HOSPITAL_COMMUNITY): Payer: Self-pay

## 2023-11-10 ENCOUNTER — Other Ambulatory Visit (HOSPITAL_COMMUNITY): Payer: Self-pay

## 2023-11-13 ENCOUNTER — Other Ambulatory Visit (HOSPITAL_COMMUNITY): Payer: Self-pay

## 2023-11-13 MED ORDER — FAMOTIDINE 20 MG PO TABS
20.0000 mg | ORAL_TABLET | Freq: Every evening | ORAL | 1 refills | Status: AC
  Filled 2023-11-13: qty 90, 90d supply, fill #0
  Filled 2024-02-18: qty 90, 90d supply, fill #1

## 2023-11-22 ENCOUNTER — Other Ambulatory Visit (HOSPITAL_COMMUNITY): Payer: Self-pay

## 2023-11-27 ENCOUNTER — Other Ambulatory Visit (HOSPITAL_COMMUNITY): Payer: Self-pay

## 2023-11-29 ENCOUNTER — Other Ambulatory Visit (HOSPITAL_COMMUNITY): Payer: Self-pay

## 2024-01-14 ENCOUNTER — Other Ambulatory Visit (HOSPITAL_COMMUNITY): Payer: Self-pay

## 2024-01-15 ENCOUNTER — Other Ambulatory Visit (HOSPITAL_COMMUNITY): Payer: Self-pay

## 2024-01-15 MED ORDER — METOPROLOL SUCCINATE ER 100 MG PO TB24
150.0000 mg | ORAL_TABLET | Freq: Every day | ORAL | 0 refills | Status: DC
  Filled 2024-01-15: qty 135, 90d supply, fill #0

## 2024-01-17 DIAGNOSIS — L821 Other seborrheic keratosis: Secondary | ICD-10-CM | POA: Diagnosis not present

## 2024-01-17 DIAGNOSIS — D0461 Carcinoma in situ of skin of right upper limb, including shoulder: Secondary | ICD-10-CM | POA: Diagnosis not present

## 2024-01-17 DIAGNOSIS — L820 Inflamed seborrheic keratosis: Secondary | ICD-10-CM | POA: Diagnosis not present

## 2024-01-17 DIAGNOSIS — L819 Disorder of pigmentation, unspecified: Secondary | ICD-10-CM | POA: Diagnosis not present

## 2024-01-17 DIAGNOSIS — D225 Melanocytic nevi of trunk: Secondary | ICD-10-CM | POA: Diagnosis not present

## 2024-01-17 DIAGNOSIS — D485 Neoplasm of uncertain behavior of skin: Secondary | ICD-10-CM | POA: Diagnosis not present

## 2024-01-17 DIAGNOSIS — Z85828 Personal history of other malignant neoplasm of skin: Secondary | ICD-10-CM | POA: Diagnosis not present

## 2024-01-30 DIAGNOSIS — D0461 Carcinoma in situ of skin of right upper limb, including shoulder: Secondary | ICD-10-CM | POA: Diagnosis not present

## 2024-02-01 ENCOUNTER — Other Ambulatory Visit (HOSPITAL_COMMUNITY): Payer: Self-pay

## 2024-02-01 DIAGNOSIS — R7301 Impaired fasting glucose: Secondary | ICD-10-CM | POA: Diagnosis not present

## 2024-02-01 DIAGNOSIS — E1165 Type 2 diabetes mellitus with hyperglycemia: Secondary | ICD-10-CM | POA: Diagnosis not present

## 2024-02-01 DIAGNOSIS — I10 Essential (primary) hypertension: Secondary | ICD-10-CM | POA: Diagnosis not present

## 2024-02-01 DIAGNOSIS — Z1159 Encounter for screening for other viral diseases: Secondary | ICD-10-CM | POA: Diagnosis not present

## 2024-02-01 DIAGNOSIS — Z0000 Encounter for general adult medical examination without abnormal findings: Secondary | ICD-10-CM | POA: Diagnosis not present

## 2024-02-01 DIAGNOSIS — E782 Mixed hyperlipidemia: Secondary | ICD-10-CM | POA: Diagnosis not present

## 2024-02-01 DIAGNOSIS — R112 Nausea with vomiting, unspecified: Secondary | ICD-10-CM | POA: Diagnosis not present

## 2024-02-01 DIAGNOSIS — K219 Gastro-esophageal reflux disease without esophagitis: Secondary | ICD-10-CM | POA: Diagnosis not present

## 2024-02-01 MED ORDER — PROMETHAZINE HCL 12.5 MG PO TABS
12.5000 mg | ORAL_TABLET | Freq: Four times a day (QID) | ORAL | 1 refills | Status: AC | PRN
  Filled 2024-02-01: qty 20, 5d supply, fill #0
  Filled 2024-09-30: qty 20, 5d supply, fill #1

## 2024-02-01 MED ORDER — AMLODIPINE BESYLATE 5 MG PO TABS
5.0000 mg | ORAL_TABLET | Freq: Every day | ORAL | 1 refills | Status: DC
  Filled 2024-02-01: qty 90, 90d supply, fill #0
  Filled 2024-04-22: qty 90, 90d supply, fill #1

## 2024-02-01 MED ORDER — OZEMPIC (2 MG/DOSE) 8 MG/3ML ~~LOC~~ SOPN
2.0000 mg | PEN_INJECTOR | SUBCUTANEOUS | 1 refills | Status: DC
Start: 2024-02-01 — End: 2024-07-24
  Filled 2024-02-01 – 2024-02-08 (×3): qty 9, 84d supply, fill #0
  Filled 2024-05-13: qty 9, 84d supply, fill #1

## 2024-02-01 MED ORDER — POTASSIUM CHLORIDE CRYS ER 20 MEQ PO TBCR
20.0000 meq | EXTENDED_RELEASE_TABLET | Freq: Every day | ORAL | 1 refills | Status: DC
  Filled 2024-02-01: qty 90, 90d supply, fill #0
  Filled 2024-05-13: qty 90, 90d supply, fill #1

## 2024-02-02 ENCOUNTER — Other Ambulatory Visit (HOSPITAL_COMMUNITY): Payer: Self-pay

## 2024-02-05 ENCOUNTER — Other Ambulatory Visit (HOSPITAL_COMMUNITY): Payer: Self-pay

## 2024-02-05 MED ORDER — VENLAFAXINE HCL ER 37.5 MG PO CP24
37.5000 mg | ORAL_CAPSULE | Freq: Every day | ORAL | 1 refills | Status: AC
  Filled 2024-02-05: qty 90, 90d supply, fill #0
  Filled 2024-04-22: qty 90, 90d supply, fill #1

## 2024-02-08 ENCOUNTER — Other Ambulatory Visit: Payer: Self-pay

## 2024-02-08 ENCOUNTER — Other Ambulatory Visit (HOSPITAL_COMMUNITY): Payer: Self-pay

## 2024-02-13 ENCOUNTER — Other Ambulatory Visit (HOSPITAL_COMMUNITY): Payer: Self-pay

## 2024-02-14 ENCOUNTER — Other Ambulatory Visit (HOSPITAL_COMMUNITY): Payer: Self-pay

## 2024-02-14 MED ORDER — METFORMIN HCL 500 MG PO TABS
500.0000 mg | ORAL_TABLET | Freq: Every day | ORAL | 0 refills | Status: DC
Start: 2024-02-14 — End: 2024-05-13
  Filled 2024-02-14: qty 90, 90d supply, fill #0

## 2024-02-18 ENCOUNTER — Other Ambulatory Visit (HOSPITAL_COMMUNITY): Payer: Self-pay

## 2024-02-19 ENCOUNTER — Other Ambulatory Visit (HOSPITAL_COMMUNITY): Payer: Self-pay

## 2024-02-19 MED ORDER — CHLORTHALIDONE 25 MG PO TABS
12.5000 mg | ORAL_TABLET | Freq: Every day | ORAL | 0 refills | Status: AC
  Filled 2024-02-19 – 2024-03-05 (×3): qty 45, 90d supply, fill #0

## 2024-03-01 ENCOUNTER — Other Ambulatory Visit (HOSPITAL_COMMUNITY): Payer: Self-pay

## 2024-03-05 ENCOUNTER — Other Ambulatory Visit: Payer: Self-pay

## 2024-03-18 ENCOUNTER — Other Ambulatory Visit (HOSPITAL_COMMUNITY): Payer: Self-pay

## 2024-03-19 DIAGNOSIS — D0461 Carcinoma in situ of skin of right upper limb, including shoulder: Secondary | ICD-10-CM | POA: Diagnosis not present

## 2024-03-19 DIAGNOSIS — Z85828 Personal history of other malignant neoplasm of skin: Secondary | ICD-10-CM | POA: Diagnosis not present

## 2024-04-22 ENCOUNTER — Other Ambulatory Visit (HOSPITAL_COMMUNITY): Payer: Self-pay

## 2024-04-23 ENCOUNTER — Other Ambulatory Visit (HOSPITAL_COMMUNITY): Payer: Self-pay

## 2024-04-23 MED ORDER — METOPROLOL SUCCINATE ER 100 MG PO TB24
150.0000 mg | ORAL_TABLET | Freq: Every day | ORAL | 1 refills | Status: DC
  Filled 2024-04-23: qty 135, 90d supply, fill #0
  Filled 2024-08-05: qty 135, 90d supply, fill #1

## 2024-05-13 ENCOUNTER — Other Ambulatory Visit (HOSPITAL_COMMUNITY): Payer: Self-pay

## 2024-05-13 MED ORDER — METFORMIN HCL 500 MG PO TABS
500.0000 mg | ORAL_TABLET | Freq: Every day | ORAL | 2 refills | Status: AC
  Filled 2024-05-13: qty 90, 90d supply, fill #0
  Filled 2024-05-24 – 2024-08-16 (×2): qty 90, 90d supply, fill #1
  Filled 2024-11-12: qty 90, 90d supply, fill #2

## 2024-05-20 ENCOUNTER — Other Ambulatory Visit (HOSPITAL_COMMUNITY): Payer: Self-pay

## 2024-05-24 ENCOUNTER — Other Ambulatory Visit (HOSPITAL_COMMUNITY): Payer: Self-pay

## 2024-05-28 ENCOUNTER — Other Ambulatory Visit (HOSPITAL_COMMUNITY): Payer: Self-pay

## 2024-05-28 MED ORDER — FAMOTIDINE 20 MG PO TABS
20.0000 mg | ORAL_TABLET | Freq: Every day | ORAL | 1 refills | Status: DC
  Filled 2024-05-28: qty 90, 90d supply, fill #0
  Filled 2024-08-16: qty 90, 90d supply, fill #1

## 2024-05-28 MED ORDER — CHLORTHALIDONE 25 MG PO TABS
12.5000 mg | ORAL_TABLET | Freq: Every morning | ORAL | 0 refills | Status: DC
  Filled 2024-05-28: qty 45, 90d supply, fill #0

## 2024-05-31 ENCOUNTER — Other Ambulatory Visit (HOSPITAL_COMMUNITY): Payer: Self-pay

## 2024-06-07 ENCOUNTER — Other Ambulatory Visit (HOSPITAL_COMMUNITY): Payer: Self-pay

## 2024-06-25 ENCOUNTER — Other Ambulatory Visit (HOSPITAL_COMMUNITY): Payer: Self-pay

## 2024-06-26 ENCOUNTER — Other Ambulatory Visit (HOSPITAL_COMMUNITY): Payer: Self-pay

## 2024-06-26 MED ORDER — LORAZEPAM 0.5 MG PO TABS
0.5000 mg | ORAL_TABLET | Freq: Every day | ORAL | 0 refills | Status: AC | PRN
  Filled 2024-06-26: qty 30, 30d supply, fill #0

## 2024-06-28 ENCOUNTER — Other Ambulatory Visit (HOSPITAL_COMMUNITY): Payer: Self-pay

## 2024-06-28 MED ORDER — PANTOPRAZOLE SODIUM 40 MG PO TBEC
40.0000 mg | DELAYED_RELEASE_TABLET | Freq: Every day | ORAL | 0 refills | Status: DC
  Filled 2024-06-28: qty 90, 90d supply, fill #0

## 2024-07-11 ENCOUNTER — Other Ambulatory Visit (HOSPITAL_COMMUNITY): Payer: Self-pay

## 2024-07-24 ENCOUNTER — Other Ambulatory Visit (HOSPITAL_COMMUNITY): Payer: Self-pay

## 2024-07-24 MED ORDER — AMLODIPINE BESYLATE 5 MG PO TABS
5.0000 mg | ORAL_TABLET | Freq: Every day | ORAL | 1 refills | Status: AC
  Filled 2024-07-24: qty 90, 90d supply, fill #0
  Filled 2024-10-28: qty 90, 90d supply, fill #1

## 2024-07-24 MED ORDER — OZEMPIC (2 MG/DOSE) 8 MG/3ML ~~LOC~~ SOPN
2.0000 mg | PEN_INJECTOR | SUBCUTANEOUS | 1 refills | Status: AC
  Filled 2024-07-24: qty 9, 84d supply, fill #0
  Filled 2024-10-29: qty 9, 84d supply, fill #1

## 2024-07-26 ENCOUNTER — Other Ambulatory Visit (HOSPITAL_COMMUNITY): Payer: Self-pay

## 2024-08-05 ENCOUNTER — Other Ambulatory Visit (HOSPITAL_COMMUNITY): Payer: Self-pay

## 2024-08-06 ENCOUNTER — Other Ambulatory Visit (HOSPITAL_COMMUNITY): Payer: Self-pay

## 2024-08-06 ENCOUNTER — Other Ambulatory Visit: Payer: Self-pay

## 2024-08-06 MED ORDER — VENLAFAXINE HCL ER 37.5 MG PO CP24
37.5000 mg | ORAL_CAPSULE | Freq: Every day | ORAL | 1 refills | Status: AC
  Filled 2024-08-06: qty 90, 90d supply, fill #0
  Filled 2024-10-28: qty 90, 90d supply, fill #1

## 2024-08-07 ENCOUNTER — Other Ambulatory Visit (HOSPITAL_COMMUNITY): Payer: Self-pay

## 2024-08-07 MED ORDER — POTASSIUM CHLORIDE CRYS ER 20 MEQ PO TBCR
20.0000 meq | EXTENDED_RELEASE_TABLET | Freq: Every day | ORAL | 1 refills | Status: AC
  Filled 2024-08-07: qty 90, 90d supply, fill #0
  Filled 2024-11-12: qty 90, 90d supply, fill #1

## 2024-08-09 ENCOUNTER — Other Ambulatory Visit (HOSPITAL_COMMUNITY): Payer: Self-pay

## 2024-08-13 ENCOUNTER — Other Ambulatory Visit (HOSPITAL_COMMUNITY): Payer: Self-pay

## 2024-08-16 ENCOUNTER — Other Ambulatory Visit (HOSPITAL_COMMUNITY): Payer: Self-pay

## 2024-08-19 ENCOUNTER — Other Ambulatory Visit (HOSPITAL_COMMUNITY): Payer: Self-pay

## 2024-08-19 MED ORDER — CHLORTHALIDONE 25 MG PO TABS
12.5000 mg | ORAL_TABLET | Freq: Every morning | ORAL | 0 refills | Status: DC
  Filled 2024-08-19: qty 45, 90d supply, fill #0

## 2024-09-30 ENCOUNTER — Other Ambulatory Visit (HOSPITAL_COMMUNITY): Payer: Self-pay

## 2024-09-30 MED ORDER — PANTOPRAZOLE SODIUM 40 MG PO TBEC
40.0000 mg | DELAYED_RELEASE_TABLET | Freq: Every day | ORAL | 0 refills | Status: AC
  Filled 2024-09-30: qty 90, 90d supply, fill #0

## 2024-10-28 ENCOUNTER — Other Ambulatory Visit (HOSPITAL_COMMUNITY): Payer: Self-pay

## 2024-10-28 MED ORDER — METOPROLOL SUCCINATE ER 100 MG PO TB24
150.0000 mg | ORAL_TABLET | Freq: Every day | ORAL | 1 refills | Status: AC
  Filled 2024-10-28: qty 135, 90d supply, fill #0

## 2024-11-12 ENCOUNTER — Other Ambulatory Visit (HOSPITAL_COMMUNITY): Payer: Self-pay

## 2024-11-12 MED ORDER — CHLORTHALIDONE 25 MG PO TABS
12.5000 mg | ORAL_TABLET | Freq: Every morning | ORAL | 0 refills | Status: AC
  Filled 2024-11-12: qty 45, 90d supply, fill #0

## 2024-11-15 ENCOUNTER — Other Ambulatory Visit (HOSPITAL_COMMUNITY): Payer: Self-pay

## 2024-11-15 LAB — OPHTHALMOLOGY REPORT-SCANNED

## 2024-11-28 ENCOUNTER — Other Ambulatory Visit (HOSPITAL_COMMUNITY): Payer: Self-pay

## 2024-11-28 MED ORDER — FAMOTIDINE 20 MG PO TABS
20.0000 mg | ORAL_TABLET | Freq: Every day | ORAL | 1 refills | Status: AC
  Filled 2024-11-28: qty 90, 90d supply, fill #0
